# Patient Record
Sex: Male | Born: 1965 | Race: White | Hispanic: No | Marital: Married | Smoking: Former smoker
Health system: Southern US, Community
[De-identification: ages and names within clinical notes are randomized; demographics above are authoritative.]

## PROBLEM LIST (undated history)

## (undated) DIAGNOSIS — G5600 Carpal tunnel syndrome, unspecified upper limb: Secondary | ICD-10-CM

## (undated) DIAGNOSIS — E785 Hyperlipidemia, unspecified: Secondary | ICD-10-CM

## (undated) DIAGNOSIS — R251 Tremor, unspecified: Secondary | ICD-10-CM

## (undated) DIAGNOSIS — E119 Type 2 diabetes mellitus without complications: Secondary | ICD-10-CM

## (undated) DIAGNOSIS — I10 Essential (primary) hypertension: Secondary | ICD-10-CM

## (undated) DIAGNOSIS — M1990 Unspecified osteoarthritis, unspecified site: Secondary | ICD-10-CM

## (undated) DIAGNOSIS — Q282 Arteriovenous malformation of cerebral vessels: Secondary | ICD-10-CM

## (undated) DIAGNOSIS — G4730 Sleep apnea, unspecified: Secondary | ICD-10-CM

## (undated) DIAGNOSIS — Z87442 Personal history of urinary calculi: Secondary | ICD-10-CM

## (undated) DIAGNOSIS — R519 Headache, unspecified: Secondary | ICD-10-CM

## (undated) DIAGNOSIS — J45909 Unspecified asthma, uncomplicated: Secondary | ICD-10-CM

## (undated) HISTORY — PX: CEREBRAL ANGIOGRAM: SHX1326

## (undated) HISTORY — PX: WISDOM TOOTH EXTRACTION: SHX21

---

## 2006-03-25 ENCOUNTER — Emergency Department: Payer: Self-pay | Admitting: General Practice

## 2006-04-15 ENCOUNTER — Ambulatory Visit: Payer: Self-pay | Admitting: Specialist

## 2008-11-25 ENCOUNTER — Emergency Department: Payer: Self-pay | Admitting: Emergency Medicine

## 2008-12-07 ENCOUNTER — Emergency Department: Payer: Self-pay | Admitting: Emergency Medicine

## 2009-02-21 ENCOUNTER — Ambulatory Visit: Payer: Self-pay | Admitting: Internal Medicine

## 2009-03-25 ENCOUNTER — Ambulatory Visit: Payer: Self-pay | Admitting: Internal Medicine

## 2009-04-11 ENCOUNTER — Ambulatory Visit: Payer: Self-pay | Admitting: Internal Medicine

## 2009-10-22 ENCOUNTER — Emergency Department: Payer: Self-pay | Admitting: Emergency Medicine

## 2011-02-03 ENCOUNTER — Ambulatory Visit: Payer: Self-pay | Admitting: Otolaryngology

## 2012-05-17 ENCOUNTER — Ambulatory Visit
Admission: RE | Admit: 2012-05-17 | Discharge: 2012-05-17 | Disposition: A | Discharge status: Home / Self Care | Payer: BC Managed Care – PPO | Source: Ambulatory Visit | Attending: Orthopaedic Surgery | Admitting: Orthopaedic Surgery

## 2012-05-17 ENCOUNTER — Other Ambulatory Visit: Payer: Self-pay | Admitting: Orthopaedic Surgery

## 2012-05-17 DIAGNOSIS — M25552 Pain in left hip: Secondary | ICD-10-CM

## 2012-05-17 MED ORDER — IOHEXOL 180 MG/ML  SOLN
18.0000 mL | Freq: Once | INTRAMUSCULAR | Status: AC | PRN
  Administered 2012-05-17: 18 mL via INTRA_ARTICULAR

## 2013-07-06 DIAGNOSIS — Z9989 Dependence on other enabling machines and devices: Secondary | ICD-10-CM

## 2013-07-06 DIAGNOSIS — G4733 Obstructive sleep apnea (adult) (pediatric): Secondary | ICD-10-CM | POA: Insufficient documentation

## 2013-07-06 DIAGNOSIS — Q282 Arteriovenous malformation of cerebral vessels: Secondary | ICD-10-CM | POA: Insufficient documentation

## 2013-07-16 DIAGNOSIS — E1129 Type 2 diabetes mellitus with other diabetic kidney complication: Secondary | ICD-10-CM | POA: Insufficient documentation

## 2014-12-14 ENCOUNTER — Ambulatory Visit: Payer: Self-pay | Admitting: Sports Medicine

## 2015-04-18 DIAGNOSIS — M1A9XX0 Chronic gout, unspecified, without tophus (tophi): Secondary | ICD-10-CM | POA: Insufficient documentation

## 2015-04-18 DIAGNOSIS — M1A00X0 Idiopathic chronic gout, unspecified site, without tophus (tophi): Secondary | ICD-10-CM | POA: Insufficient documentation

## 2015-04-18 DIAGNOSIS — E782 Mixed hyperlipidemia: Secondary | ICD-10-CM | POA: Insufficient documentation

## 2015-05-02 ENCOUNTER — Other Ambulatory Visit: Payer: Self-pay | Admitting: Internal Medicine

## 2015-05-02 DIAGNOSIS — R51 Headache: Principal | ICD-10-CM

## 2015-05-02 DIAGNOSIS — R519 Headache, unspecified: Secondary | ICD-10-CM

## 2015-05-02 DIAGNOSIS — G9340 Encephalopathy, unspecified: Secondary | ICD-10-CM

## 2015-05-08 ENCOUNTER — Ambulatory Visit
Admission: RE | Admit: 2015-05-08 | Discharge: 2015-05-08 | Disposition: A | Discharge status: Home / Self Care | Payer: BLUE CROSS/BLUE SHIELD | Source: Ambulatory Visit | Attending: Internal Medicine | Admitting: Internal Medicine

## 2015-05-08 DIAGNOSIS — R51 Headache: Secondary | ICD-10-CM | POA: Insufficient documentation

## 2015-05-08 DIAGNOSIS — Q283 Other malformations of cerebral vessels: Secondary | ICD-10-CM | POA: Diagnosis not present

## 2015-05-08 DIAGNOSIS — G9340 Encephalopathy, unspecified: Secondary | ICD-10-CM

## 2015-05-08 DIAGNOSIS — R519 Headache, unspecified: Secondary | ICD-10-CM

## 2015-05-08 DIAGNOSIS — Q282 Arteriovenous malformation of cerebral vessels: Secondary | ICD-10-CM | POA: Diagnosis present

## 2015-05-08 MED ORDER — GADOBENATE DIMEGLUMINE 529 MG/ML IV SOLN
20.0000 mL | Freq: Once | INTRAVENOUS | Status: AC | PRN
  Administered 2015-05-08: 20 mL via INTRAVENOUS

## 2015-07-08 ENCOUNTER — Encounter: Payer: Self-pay | Admitting: *Deleted

## 2015-07-09 ENCOUNTER — Encounter
Admission: RE | Disposition: A | Discharge status: Home / Self Care | Payer: Self-pay | Source: Ambulatory Visit | Attending: Gastroenterology

## 2015-07-09 ENCOUNTER — Ambulatory Visit: Payer: BLUE CROSS/BLUE SHIELD | Admitting: Anesthesiology

## 2015-07-09 ENCOUNTER — Ambulatory Visit
Admission: RE | Admit: 2015-07-09 | Discharge: 2015-07-09 | Disposition: A | Discharge status: Home / Self Care | Payer: BLUE CROSS/BLUE SHIELD | Source: Ambulatory Visit | Attending: Gastroenterology | Admitting: Gastroenterology

## 2015-07-09 DIAGNOSIS — I10 Essential (primary) hypertension: Secondary | ICD-10-CM | POA: Diagnosis not present

## 2015-07-09 DIAGNOSIS — G4730 Sleep apnea, unspecified: Secondary | ICD-10-CM | POA: Insufficient documentation

## 2015-07-09 DIAGNOSIS — Z87891 Personal history of nicotine dependence: Secondary | ICD-10-CM | POA: Diagnosis not present

## 2015-07-09 DIAGNOSIS — Z6839 Body mass index (BMI) 39.0-39.9, adult: Secondary | ICD-10-CM | POA: Insufficient documentation

## 2015-07-09 DIAGNOSIS — M1990 Unspecified osteoarthritis, unspecified site: Secondary | ICD-10-CM | POA: Diagnosis not present

## 2015-07-09 DIAGNOSIS — R195 Other fecal abnormalities: Secondary | ICD-10-CM | POA: Diagnosis not present

## 2015-07-09 DIAGNOSIS — Z79899 Other long term (current) drug therapy: Secondary | ICD-10-CM | POA: Insufficient documentation

## 2015-07-09 DIAGNOSIS — E119 Type 2 diabetes mellitus without complications: Secondary | ICD-10-CM | POA: Insufficient documentation

## 2015-07-09 DIAGNOSIS — K625 Hemorrhage of anus and rectum: Secondary | ICD-10-CM | POA: Insufficient documentation

## 2015-07-09 DIAGNOSIS — Z8371 Family history of colonic polyps: Secondary | ICD-10-CM | POA: Insufficient documentation

## 2015-07-09 DIAGNOSIS — Z7984 Long term (current) use of oral hypoglycemic drugs: Secondary | ICD-10-CM | POA: Diagnosis not present

## 2015-07-09 DIAGNOSIS — J45909 Unspecified asthma, uncomplicated: Secondary | ICD-10-CM | POA: Diagnosis not present

## 2015-07-09 DIAGNOSIS — K644 Residual hemorrhoidal skin tags: Secondary | ICD-10-CM | POA: Diagnosis not present

## 2015-07-09 HISTORY — DX: Unspecified osteoarthritis, unspecified site: M19.90

## 2015-07-09 HISTORY — PX: COLONOSCOPY WITH PROPOFOL: SHX5780

## 2015-07-09 HISTORY — DX: Hyperlipidemia, unspecified: E78.5

## 2015-07-09 HISTORY — DX: Unspecified asthma, uncomplicated: J45.909

## 2015-07-09 HISTORY — DX: Arteriovenous malformation of cerebral vessels: Q28.2

## 2015-07-09 HISTORY — DX: Sleep apnea, unspecified: G47.30

## 2015-07-09 HISTORY — DX: Type 2 diabetes mellitus without complications: E11.9

## 2015-07-09 HISTORY — DX: Carpal tunnel syndrome, unspecified upper limb: G56.00

## 2015-07-09 HISTORY — DX: Essential (primary) hypertension: I10

## 2015-07-09 LAB — GLUCOSE, CAPILLARY: Glucose-Capillary: 101 mg/dL — ABNORMAL HIGH (ref 65–99)

## 2015-07-09 SURGERY — COLONOSCOPY WITH PROPOFOL
Anesthesia: General

## 2015-07-09 MED ORDER — SODIUM CHLORIDE 0.9 % IV SOLN
INTRAVENOUS | Status: DC
  Administered 2015-07-09: 1000 mL via INTRAVENOUS

## 2015-07-09 MED ORDER — PROPOFOL 500 MG/50ML IV EMUL
INTRAVENOUS | Status: DC | PRN
  Administered 2015-07-09: 140 ug/kg/min via INTRAVENOUS

## 2015-07-09 MED ORDER — PHENYLEPHRINE HCL 10 MG/ML IJ SOLN
INTRAMUSCULAR | Status: DC | PRN
  Administered 2015-07-09 (×2): 25 ug via INTRAVENOUS

## 2015-07-09 MED ORDER — FENTANYL CITRATE (PF) 100 MCG/2ML IJ SOLN
INTRAMUSCULAR | Status: DC | PRN
  Administered 2015-07-09: 25 ug via INTRAVENOUS
  Administered 2015-07-09: 50 ug via INTRAVENOUS
  Administered 2015-07-09: 25 ug via INTRAVENOUS

## 2015-07-09 MED ORDER — MIDAZOLAM HCL 2 MG/2ML IJ SOLN
INTRAMUSCULAR | Status: DC | PRN
  Administered 2015-07-09: 2 mg via INTRAVENOUS

## 2015-07-09 MED ORDER — SODIUM CHLORIDE 0.9 % IV SOLN: INTRAVENOUS | Status: DC

## 2015-07-09 NOTE — Anesthesia Postprocedure Evaluation (Signed)
Anesthesia Post Note  Patient: Jerry FootmanChristopher W Sandoval  Procedure(s) Performed: Procedure(s) (LRB): COLONOSCOPY WITH PROPOFOL (N/A)  Patient location during evaluation: PACU Anesthesia Type: General Level of consciousness: awake Pain management: pain level controlled Vital Signs Assessment: post-procedure vital signs reviewed and stable Respiratory status: spontaneous breathing Cardiovascular status: stable Anesthetic complications: no    Last Vitals:  Filed Vitals:   07/09/15 1210 07/09/15 1220  BP: 112/75 109/72  Pulse: 79 77  Temp:    Resp: 20 25    Last Pain: There were no vitals filed for this visit.               VAN STAVEREN,Sujey Gundry

## 2015-07-09 NOTE — Anesthesia Preprocedure Evaluation (Signed)
Anesthesia Evaluation  Patient identified by MRN, date of birth, ID band Patient awake    Reviewed: Allergy & Precautions, NPO status , Patient's Chart, lab work & pertinent test results  Airway Mallampati: II       Dental  (+) Teeth Intact   Pulmonary sleep apnea , former smoker,    + rhonchi        Cardiovascular Exercise Tolerance: Good hypertension, Pt. on medications  Rhythm:Regular Rate:Normal     Neuro/Psych    GI/Hepatic negative GI ROS, Neg liver ROS,   Endo/Other  diabetes, Well Controlled, Type 2, Oral Hypoglycemic AgentsMorbid obesity  Renal/GU negative Renal ROS     Musculoskeletal   Abdominal (+) + obese,   Peds  Hematology negative hematology ROS (+)   Anesthesia Other Findings   Reproductive/Obstetrics                             Anesthesia Physical Anesthesia Plan  ASA: III  Anesthesia Plan: General   Post-op Pain Management:    Induction: Intravenous  Airway Management Planned: Natural Airway and Nasal Cannula  Additional Equipment:   Intra-op Plan:   Post-operative Plan:   Informed Consent: I have reviewed the patients History and Physical, chart, labs and discussed the procedure including the risks, benefits and alternatives for the proposed anesthesia with the patient or authorized representative who has indicated his/her understanding and acceptance.     Plan Discussed with: CRNA  Anesthesia Plan Comments:         Anesthesia Quick Evaluation

## 2015-07-09 NOTE — Anesthesia Procedure Notes (Signed)
Performed by: Tonia GhentOOK-MARTIN, Jennie Bolar Pre-anesthesia Checklist: Patient identified, Emergency Drugs available, Suction available, Patient being monitored and Timeout performed Patient Re-evaluated:Patient Re-evaluated prior to inductionOxygen Delivery Method: Non-rebreather mask Preoxygenation: Pre-oxygenation with 100% oxygen Intubation Type: IV induction Placement Confirmation: CO2 detector and positive ETCO2

## 2015-07-09 NOTE — H&P (Signed)
Outpatient short stay form Pre-procedure 07/09/2015 11:05 AM Jerry DeemMartin U Shervon Kerwin MD  Primary Physician: Dr. Bethann PunchesMark Miller  Reason for visit:  Colonoscopy  History of present illness:  Patient is a 50 year old male presenting today for further evaluation of a Hemoccult-positive stool. He states is also seen some intermittent rectal bleeding that he describes as being painless. That he had hemorrhoids in the past however feels he may have him. He was no dyschezia. There is a family history of colon polyps but no colon cancer. Tolerated his prep well. He takes no aspirin or blood thinning products.    Current facility-administered medications:  .  0.9 %  sodium chloride infusion, , Intravenous, Continuous, Jerry DeemMartin U Marjani Kobel, MD, Last Rate: 20 mL/hr at 07/09/15 1031, 1,000 mL at 07/09/15 1031 .  0.9 %  sodium chloride infusion, , Intravenous, Continuous, Jerry DeemMartin U Gerarda Conklin, MD  Prescriptions prior to admission  Medication Sig Dispense Refill Last Dose  . albuterol (PROVENTIL HFA;VENTOLIN HFA) 108 (90 Base) MCG/ACT inhaler Inhale 2 puffs into the lungs every 6 (six) hours as needed for wheezing or shortness of breath.     . allopurinol (ZYLOPRIM) 100 MG tablet Take 100 mg by mouth daily.     Marland Kitchen. allopurinol (ZYLOPRIM) 300 MG tablet Take 300 mg by mouth daily.     . cetirizine (ZYRTEC) 10 MG tablet Take 10 mg by mouth daily.     . cyclobenzaprine (FLEXERIL) 10 MG tablet Take 10 mg by mouth 2 (two) times daily as needed for muscle spasms.     . diazepam (VALIUM) 5 MG tablet Take 5 mg by mouth daily.     . indomethacin (INDOCIN) 50 MG capsule Take 50 mg by mouth 2 (two) times daily with a meal.     . lisinopril-hydrochlorothiazide (PRINZIDE,ZESTORETIC) 20-12.5 MG tablet Take 1 tablet by mouth daily.     . magnesium oxide (MAG-OX) 400 MG tablet Take 400 mg by mouth daily.     . metFORMIN (GLUCOPHAGE) 500 MG tablet Take 1,000 mg by mouth 2 (two) times daily with a meal.     . topiramate (TOPAMAX) 50 MG  tablet Take 50 mg by mouth 2 (two) times daily.     . Vitamin D, Ergocalciferol, (DRISDOL) 50000 units CAPS capsule Take 50,000 Units by mouth every 7 (seven) days.        No Known Allergies   Past Medical History  Diagnosis Date  . Asthma   . Arteriovenous malformation of cerebral vessels   . Carpal tunnel syndrome   . Arthritis   . Diabetes mellitus without complication (HCC)   . Hypertension   . Hyperlipidemia   . Sleep apnea     Review of systems:      Physical Exam    Heart and lungs: Regular rate and rhythm without rub or gallop, lungs are bilaterally clear.    HEENT: Normocephalic atraumatic eyes are anicteric    Other:     Pertinant exam for procedure: Soft nontender nondistended bowel sounds positive normoactive.    Planned proceedures: Colonoscopy and indicated procedures. I have discussed the risks benefits and complications of procedures to include not limited to bleeding, infection, perforation and the risk of sedation and the patient wishes to proceed.    Jerry DeemMartin U Saahas Hidrogo, MD Gastroenterology 07/09/2015  11:05 AM

## 2015-07-09 NOTE — Op Note (Signed)
[facility] Patient Name: Jerry Sandoval Procedure Date: 07/09/2015 11:07 AM MRN: [id number] Account #: [id number] Date of Birth: 03-Apr-1965 Admit Type: Outpatient Age: 50 Room: [facility] ENDO ROOM 3 Gender: Male Note Status: Finalized Procedure:            Colonoscopy Indications:          Heme positive stool, Rectal bleeding Providers:            Christena Deem, MD Referring MD:         Danella Penton, MD (Referring MD) Medicines:            Monitored Anesthesia Care Complications:        No immediate complications. Procedure:            Pre-Anesthesia Assessment:                       - ASA Grade Assessment: III - A patient with severe                        systemic disease.                       After obtaining informed consent, the colonoscope was                        passed under direct vision. Throughout the procedure,                        the patient's blood pressure, pulse, and oxygen                        saturations were monitored continuously. The                        Colonoscope was introduced through the anus and                        advanced to the the cecum, identified by the ileocecal                        valve. The quality of the bowel preparation was good.                        The colonoscopy was unusually difficult due to                        significant looping and the patient's body habitus. Findings:      Non-bleeding external hemorrhoids were found during perianal exam. The       hemorrhoids were small.      The entire examined colon appeared normal. Despite multiple efforts I       was unable to turn the ileocecal valve to see the base of the cecum,       however seen from a distance there is no appareent abnormality.      The retroflexed view of the distal rectum and anal verge was normal and       showed no anal or rectal abnormalities.      The digital rectal exam findings include non-thrombosed  external       hemorrhoids. Impression:           -  Non-bleeding external hemorrhoids.                       - The entire examined colon is normal.                       - The distal rectum and anal verge are normal on                        retroflexion view.                       - Non-thrombosed external hemorrhoids found on digital                        rectal exam.                       - No specimens collected. Recommendation:       - Use Analpram HC Cream 2.5%: Apply externally TID for                        10 days.                       - Repeat colonoscopy in 5 years for screening purposes. Procedure Code(s):    --- Professional ---                       [phone] [id number], Colonoscopy, flexible; diagnostic, including                        collection of specimen(s) by brushing or washing, when                        performed (separate procedure) Diagnosis Code(s):    --- Professional ---                       K64.4, Residual hemorrhoidal skin tags                       R19.5, Other fecal abnormalities                       K62.5, Hemorrhage of anus and rectum CPT copyright 2016 American Medical Association. All rights reserved. The codes documented in this report are preliminary and upon coder review may  be revised to meet current compliance requirements. Christena DeemMartin U Skulskie, MD 07/09/2015 11:50:34 AM This report has been signed electronically. Number of Addenda: 0 Note Initiated On: 07/09/2015 11:07 AM Scope Withdrawal Time: 0 hours 6 minutes 30 seconds  Total Procedure Duration: 0 hours 27 minutes 46 seconds       [facility]

## 2015-07-09 NOTE — Transfer of Care (Signed)
Immediate Anesthesia Transfer of Care Note  Patient: Jerry FootmanChristopher W Sandoval  Procedure(s) Performed: Procedure(s): COLONOSCOPY WITH PROPOFOL (N/A)  Patient Location: PACU  Anesthesia Type:General  Level of Consciousness: awake and sedated  Airway & Oxygen Therapy: Patient Spontanous Breathing and Patient connected to face mask oxygen  Post-op Assessment: Report given to RN and Post -op Vital signs reviewed and stable  Post vital signs: Reviewed and stable  Last Vitals:  Filed Vitals:   07/09/15 1011  BP: 117/77  Pulse: 96  Temp: 37.1 C  Resp: 18    Last Pain: There were no vitals filed for this visit.       Complications: No apparent anesthesia complications

## 2015-07-10 ENCOUNTER — Encounter: Payer: Self-pay | Admitting: Gastroenterology

## 2015-08-15 ENCOUNTER — Ambulatory Visit: Payer: BLUE CROSS/BLUE SHIELD | Attending: Neurology

## 2015-08-15 DIAGNOSIS — R634 Abnormal weight loss: Secondary | ICD-10-CM | POA: Insufficient documentation

## 2015-08-15 DIAGNOSIS — G4733 Obstructive sleep apnea (adult) (pediatric): Secondary | ICD-10-CM | POA: Diagnosis not present

## 2015-10-22 DIAGNOSIS — K649 Unspecified hemorrhoids: Secondary | ICD-10-CM | POA: Insufficient documentation

## 2015-10-22 DIAGNOSIS — I10 Essential (primary) hypertension: Secondary | ICD-10-CM | POA: Insufficient documentation

## 2016-03-22 ENCOUNTER — Inpatient Hospital Stay
Admission: EM | Admit: 2016-03-22 | Discharge: 2016-03-25 | DRG: 152 | Disposition: A | Discharge status: Home / Self Care | Payer: BLUE CROSS/BLUE SHIELD | Attending: Internal Medicine | Admitting: Internal Medicine

## 2016-03-22 ENCOUNTER — Emergency Department: Payer: BLUE CROSS/BLUE SHIELD

## 2016-03-22 ENCOUNTER — Encounter: Payer: Self-pay | Admitting: Emergency Medicine

## 2016-03-22 DIAGNOSIS — G4730 Sleep apnea, unspecified: Secondary | ICD-10-CM | POA: Diagnosis present

## 2016-03-22 DIAGNOSIS — Q283 Other malformations of cerebral vessels: Secondary | ICD-10-CM

## 2016-03-22 DIAGNOSIS — R509 Fever, unspecified: Secondary | ICD-10-CM

## 2016-03-22 DIAGNOSIS — E876 Hypokalemia: Secondary | ICD-10-CM | POA: Diagnosis present

## 2016-03-22 DIAGNOSIS — Z6841 Body Mass Index (BMI) 40.0 and over, adult: Secondary | ICD-10-CM

## 2016-03-22 DIAGNOSIS — J45909 Unspecified asthma, uncomplicated: Secondary | ICD-10-CM | POA: Diagnosis present

## 2016-03-22 DIAGNOSIS — Z720 Tobacco use: Secondary | ICD-10-CM

## 2016-03-22 DIAGNOSIS — E119 Type 2 diabetes mellitus without complications: Secondary | ICD-10-CM | POA: Diagnosis present

## 2016-03-22 DIAGNOSIS — R000 Tachycardia, unspecified: Secondary | ICD-10-CM

## 2016-03-22 DIAGNOSIS — E860 Dehydration: Secondary | ICD-10-CM | POA: Diagnosis present

## 2016-03-22 DIAGNOSIS — E669 Obesity, unspecified: Secondary | ICD-10-CM | POA: Diagnosis present

## 2016-03-22 DIAGNOSIS — J069 Acute upper respiratory infection, unspecified: Principal | ICD-10-CM | POA: Diagnosis present

## 2016-03-22 DIAGNOSIS — E785 Hyperlipidemia, unspecified: Secondary | ICD-10-CM | POA: Diagnosis present

## 2016-03-22 DIAGNOSIS — I10 Essential (primary) hypertension: Secondary | ICD-10-CM | POA: Diagnosis present

## 2016-03-22 DIAGNOSIS — R6510 Systemic inflammatory response syndrome (SIRS) of non-infectious origin without acute organ dysfunction: Secondary | ICD-10-CM | POA: Diagnosis present

## 2016-03-22 DIAGNOSIS — H9201 Otalgia, right ear: Secondary | ICD-10-CM

## 2016-03-22 DIAGNOSIS — Z7984 Long term (current) use of oral hypoglycemic drugs: Secondary | ICD-10-CM

## 2016-03-22 DIAGNOSIS — E871 Hypo-osmolality and hyponatremia: Secondary | ICD-10-CM | POA: Diagnosis present

## 2016-03-22 DIAGNOSIS — H6691 Otitis media, unspecified, right ear: Secondary | ICD-10-CM | POA: Diagnosis present

## 2016-03-22 LAB — URINALYSIS, COMPLETE (UACMP) WITH MICROSCOPIC
Bacteria, UA: NONE SEEN
Bilirubin Urine: NEGATIVE
Glucose, UA: NEGATIVE mg/dL
Ketones, ur: NEGATIVE mg/dL
Leukocytes, UA: NEGATIVE
Nitrite: NEGATIVE
Protein, ur: NEGATIVE mg/dL
Specific Gravity, Urine: 1.008 (ref 1.005–1.030)
Squamous Epithelial / LPF: NONE SEEN
pH: 5 (ref 5.0–8.0)

## 2016-03-22 LAB — CBC WITH DIFFERENTIAL/PLATELET
Basophils Absolute: 0.1 10*3/uL (ref 0–0.1)
Basophils Relative: 1 %
Eosinophils Absolute: 0.3 10*3/uL (ref 0–0.7)
Eosinophils Relative: 2 %
HCT: 45.3 % (ref 40.0–52.0)
Hemoglobin: 15 g/dL (ref 13.0–18.0)
Lymphocytes Relative: 20 %
Lymphs Abs: 2.5 10*3/uL (ref 1.0–3.6)
MCH: 30.1 pg (ref 26.0–34.0)
MCHC: 33 g/dL (ref 32.0–36.0)
MCV: 91.1 fL (ref 80.0–100.0)
Monocytes Absolute: 1 10*3/uL (ref 0.2–1.0)
Monocytes Relative: 8 %
Neutro Abs: 8.7 10*3/uL — ABNORMAL HIGH (ref 1.4–6.5)
Neutrophils Relative %: 69 %
Platelets: 279 10*3/uL (ref 150–440)
RBC: 4.97 MIL/uL (ref 4.40–5.90)
RDW: 14.4 % (ref 11.5–14.5)
WBC: 12.6 10*3/uL — ABNORMAL HIGH (ref 3.8–10.6)

## 2016-03-22 LAB — COMPREHENSIVE METABOLIC PANEL
ALT: 25 U/L (ref 17–63)
AST: 25 U/L (ref 15–41)
Albumin: 4.8 g/dL (ref 3.5–5.0)
Alkaline Phosphatase: 50 U/L (ref 38–126)
Anion gap: 11 (ref 5–15)
BUN: 15 mg/dL (ref 6–20)
CO2: 24 mmol/L (ref 22–32)
Calcium: 9.5 mg/dL (ref 8.9–10.3)
Chloride: 98 mmol/L — ABNORMAL LOW (ref 101–111)
Creatinine, Ser: 1.23 mg/dL (ref 0.61–1.24)
GFR calc Af Amer: >60 mL/min (ref >60)
GFR calc non Af Amer: >60 mL/min (ref >60)
Glucose, Bld: 109 mg/dL — ABNORMAL HIGH (ref 65–99)
Potassium: 3.5 mmol/L (ref 3.5–5.1)
Sodium: 133 mmol/L — ABNORMAL LOW (ref 135–145)
Total Bilirubin: 0.9 mg/dL (ref 0.3–1.2)
Total Protein: 8.2 g/dL — ABNORMAL HIGH (ref 6.5–8.1)

## 2016-03-22 LAB — PROTIME-INR
INR: 1.04
Prothrombin Time: 13.6 seconds (ref 11.4–15.2)

## 2016-03-22 LAB — INFLUENZA PANEL BY PCR (TYPE A & B)
Influenza A By PCR: NEGATIVE
Influenza B By PCR: NEGATIVE

## 2016-03-22 LAB — LACTIC ACID, PLASMA: Lactic Acid, Venous: 1.9 mmol/L (ref 0.5–1.9)

## 2016-03-22 LAB — GLUCOSE, CAPILLARY: Glucose-Capillary: 120 mg/dL — ABNORMAL HIGH (ref 65–99)

## 2016-03-22 MED ORDER — ACETAMINOPHEN 500 MG PO TABS
1000.0000 mg | ORAL_TABLET | Freq: Once | ORAL | Status: AC
  Administered 2016-03-22: 1000 mg via ORAL
  Filled 2016-03-22: qty 2

## 2016-03-22 MED ORDER — SODIUM CHLORIDE 0.9 % IV BOLUS (SEPSIS)
1000.0000 mL | Freq: Once | INTRAVENOUS | Status: AC
  Administered 2016-03-22: 1000 mL via INTRAVENOUS

## 2016-03-22 MED ORDER — SODIUM CHLORIDE 0.9 % IV BOLUS (SEPSIS)
1000.0000 mL | Freq: Once | INTRAVENOUS | Status: AC
Start: 2016-03-22 — End: 2016-03-23
  Administered 2016-03-22: 1000 mL via INTRAVENOUS

## 2016-03-22 NOTE — ED Provider Notes (Addendum)
[facility] >[facility] Emergency Department Provider Note  ____________________________________________   I have reviewed the triage vital signs and the nursing notes.   HISTORY  Chief Complaint Fever; Dizziness; and Cough    HPI Jerry Sandoval is a 51 y.o. male  presents today plenty of cough, fever, rhinorrhea, and a few loose stools. Has had decreased by mouth. Started on Augmentin a few days ago. Denies severe headache, denies abdominal pain, cough is nonproductive. Positive rhinorrhea. Did see his doctor. Thought he had flu symptoms       Past Medical History:  Diagnosis Date  . Arteriovenous malformation of cerebral vessels   . Arthritis   . Asthma   . Carpal tunnel syndrome   . Diabetes mellitus without complication (HCC)   . Hyperlipidemia   . Hypertension   . Sleep apnea     There are no active problems to display for this patient.   Past Surgical History:  Procedure Laterality Date  . CEREBRAL ANGIOGRAM    . COLONOSCOPY WITH PROPOFOL N/A 07/09/2015   Procedure: COLONOSCOPY WITH PROPOFOL;  Surgeon: Christena Deem, MD;  Location: [facility] ENDOSCOPY;  Service: Endoscopy;  Laterality: N/A;  . WISDOM TOOTH EXTRACTION      Prior to Admission medications   Medication Sig Start Date End Date Taking? Authorizing Provider  albuterol (PROVENTIL HFA;VENTOLIN HFA) 108 (90 Base) MCG/ACT inhaler Inhale 2 puffs into the lungs every 6 (six) hours as needed for wheezing or shortness of breath.    Historical Provider, MD  allopurinol (ZYLOPRIM) 100 MG tablet Take 100 mg by mouth daily.    Historical Provider, MD  allopurinol (ZYLOPRIM) 300 MG tablet Take 300 mg by mouth daily.    Historical Provider, MD  cetirizine (ZYRTEC) 10 MG tablet Take 10 mg by mouth daily.    Historical Provider, MD  cyclobenzaprine (FLEXERIL) 10 MG tablet Take 10 mg by mouth 2 (two) times daily as needed for muscle spasms.    Historical Provider, MD  diazepam  (VALIUM) 5 MG tablet Take 5 mg by mouth daily.    Historical Provider, MD  indomethacin (INDOCIN) 50 MG capsule Take 50 mg by mouth 2 (two) times daily with a meal.    Historical Provider, MD  lisinopril-hydrochlorothiazide (PRINZIDE,ZESTORETIC) 20-12.5 MG tablet Take 1 tablet by mouth daily.    Historical Provider, MD  magnesium oxide (MAG-OX) 400 MG tablet Take 400 mg by mouth daily.    Historical Provider, MD  metFORMIN (GLUCOPHAGE) 500 MG tablet Take 1,000 mg by mouth 2 (two) times daily with a meal.    Historical Provider, MD  topiramate (TOPAMAX) 50 MG tablet Take 50 mg by mouth 2 (two) times daily.    Historical Provider, MD  Vitamin D, Ergocalciferol, (DRISDOL) 50000 units CAPS capsule Take 50,000 Units by mouth every 7 (seven) days.    Historical Provider, MD    Allergies Patient has no known allergies.  History reviewed. No pertinent family history.  Social History Social History  Substance Use Topics  . Smoking status: Former Games developer  . Smokeless tobacco: Current User  . Alcohol use No    Review of Systems Constitutional: Positive fever/chills Eyes: No visual changes. ENT: No sore throat. No stiff neck no neck pain Cardiovascular: Denies chest pain. Respiratory: Denies shortness of breath.All that of cough Gastrointestinal:   no vomiting.  No diarrhea.  No constipation. Genitourinary: Negative for dysuria. Musculoskeletal: Negative lower extremity swelling Skin: Negative for rash. Neurological: Negative for severe headaches, focal weakness  or numbness. 10-point ROS otherwise negative.  ____________________________________________   PHYSICAL EXAM:  VITAL SIGNS: ED Triage Vitals  Enc Vitals Group     BP 03/22/16 1828 135/84     Pulse Rate 03/22/16 1828 (!) 137     Resp 03/22/16 1828 (!) 22     Temp 03/22/16 1828 (!) 102.6 F (39.2 C)     Temp Source 03/22/16 1828 Oral     SpO2 03/22/16 1828 96 %     Weight --      Height --      Head Circumference --       Peak Flow --      Pain Score 03/22/16 1956 10     Pain Loc --      Pain Edu? --      Excl. in GC? --     Constitutional: Alert and oriented. Well appearing and in no acute distress.Patient does not appear sick is his heart rate would suggest. Watching TV in no acute distress. Eyes: Conjunctivae are normal. PERRL. EOMI. Head: Atraumatic. Nose: Positive congestion/rhinnorhea. TMs are normal Mouth/Throat: Mucous membranes are dry.  Oropharynx non-erythematous. Neck: No stridor.   Nontender with no meningismus Cardiovascular: Tachycardia noted, regular rhythm. Grossly normal heart sounds.  Good peripheral circulation. Respiratory: Normal respiratory effort.  No retractions. Lungs CTAB. Abdominal: Soft and nontender. No distention. No guarding no rebound Back:  There is no focal tenderness or step off.  there is no midline tenderness there are no lesions noted. there is no CVA tenderness Musculoskeletal: No lower extremity tenderness, no upper extremity tenderness. No joint effusions, no DVT signs strong distal pulses no edema Neurologic:  Normal speech and language. No gross focal neurologic deficits are appreciated.  Skin:  Skin is warm, dry and intact. No rash noted. Psychiatric: Mood and affect are normal. Speech and behavior are normal.  ____________________________________________   LABS (all labs ordered are listed, but only abnormal results are displayed)  Labs Reviewed  COMPREHENSIVE METABOLIC PANEL - Abnormal; Notable for the following:       Result Value   Sodium 133 (*)    Chloride 98 (*)    Glucose, Bld 109 (*)    Total Protein 8.2 (*)    All other components within normal limits  CBC WITH DIFFERENTIAL/PLATELET - Abnormal; Notable for the following:    WBC 12.6 (*)    Neutro Abs 8.7 (*)    All other components within normal limits  GLUCOSE, CAPILLARY - Abnormal; Notable for the following:    Glucose-Capillary 120 (*)    All other components within normal limits   CULTURE, BLOOD (ROUTINE X 2)  CULTURE, BLOOD (ROUTINE X 2)  URINE CULTURE  LACTIC ACID, PLASMA  PROTIME-INR  INFLUENZA PANEL BY PCR (TYPE A & B)  LACTIC ACID, PLASMA  URINALYSIS, COMPLETE (UACMP) WITH MICROSCOPIC   ____________________________________________  EKG  I personally interpreted any EKGs ordered by me or triage Sinus tachycardia rate 131 no acute ST elevation or depression, borderline LAD. ____________________________________________  RADIOLOGY  I reviewed any imaging ordered by me or triage that were performed during my shift and, if possible, patient and/or family made aware of any abnormal findings. ____________________________________________   PROCEDURES  Procedure(s) performed: None  Procedures  Critical Care performed: None  ____________________________________________   INITIAL IMPRESSION / ASSESSMENT AND PLAN / ED COURSE  Pertinent labs & imaging results that were available during my care of the patient were reviewed by me and considered in my medical decision making (  see chart for details).  Patient with URI symptoms, cough cold congestion fever. Flu is negative. White count reassuring. He was tachycardic initially but that is rapidly coming in with IV fluid. Low suspicion for sepsis given patient's negative lactic acid regular white count etc. Clear viral syndrome pathology with cough etc. Some mild internal vascular depletion is probably present with hemoglobin of 15 and creatinine of 1.23. Already on antibiotics. It is my hope that we can hydrate him and he will feel better. We'll watch him closely in the emergency department. abd is benign chest x-ray is clear     Signed out to dr. Don Perking at the end of my shift. ____________________________________________   FINAL CLINICAL IMPRESSION(S) / ED DIAGNOSES  Final diagnoses:  None      This chart was dictated using voice recognition software.  Despite best efforts to proofread,  errors can  occur which can change meaning.      Jeanmarie Plant, MD 03/22/16 2018    Jeanmarie Plant, MD 03/22/16 2030

## 2016-03-22 NOTE — ED Triage Notes (Signed)
Pt reports cough and fever sxs that started earlier this past week. Pt was started on Augmentin on Tuesday for 10 days, continues to have fever and cough and congestion. Pt dizzy in lobby, unsteady gait.  Pt took tylenol 1000mg  2hours PTA.  Pt is also tremulous and is a diabetic. Did receive flu shot.

## 2016-03-22 NOTE — ED Notes (Signed)
Pt taken to xray 

## 2016-03-23 ENCOUNTER — Encounter: Payer: Self-pay | Admitting: Internal Medicine

## 2016-03-23 ENCOUNTER — Observation Stay: Payer: BLUE CROSS/BLUE SHIELD

## 2016-03-23 DIAGNOSIS — R6510 Systemic inflammatory response syndrome (SIRS) of non-infectious origin without acute organ dysfunction: Secondary | ICD-10-CM | POA: Diagnosis present

## 2016-03-23 LAB — TROPONIN I
Troponin I: <0.03 ng/mL (ref <0.03)
Troponin I: <0.03 ng/mL (ref <0.03)
Troponin I: <0.03 ng/mL (ref <0.03)

## 2016-03-23 LAB — GLUCOSE, CAPILLARY
Glucose-Capillary: 129 mg/dL — ABNORMAL HIGH (ref 65–99)
Glucose-Capillary: 90 mg/dL (ref 65–99)
Glucose-Capillary: 111 mg/dL — ABNORMAL HIGH (ref 65–99)
Glucose-Capillary: 86 mg/dL (ref 65–99)

## 2016-03-23 LAB — BASIC METABOLIC PANEL
Anion gap: 7 (ref 5–15)
BUN: 14 mg/dL (ref 6–20)
CO2: 22 mmol/L (ref 22–32)
Calcium: 8.7 mg/dL — ABNORMAL LOW (ref 8.9–10.3)
Chloride: 106 mmol/L (ref 101–111)
Creatinine, Ser: 1.28 mg/dL — ABNORMAL HIGH (ref 0.61–1.24)
GFR calc Af Amer: >60 mL/min (ref >60)
GFR calc non Af Amer: >60 mL/min (ref >60)
Glucose, Bld: 126 mg/dL — ABNORMAL HIGH (ref 65–99)
Potassium: 3.6 mmol/L (ref 3.5–5.1)
Sodium: 135 mmol/L (ref 135–145)

## 2016-03-23 LAB — CBC
HCT: 38.3 % — ABNORMAL LOW (ref 40.0–52.0)
Hemoglobin: 13.3 g/dL (ref 13.0–18.0)
MCH: 31.4 pg (ref 26.0–34.0)
MCHC: 34.8 g/dL (ref 32.0–36.0)
MCV: 90.2 fL (ref 80.0–100.0)
Platelets: 219 10*3/uL (ref 150–440)
RBC: 4.25 MIL/uL — ABNORMAL LOW (ref 4.40–5.90)
RDW: 14 % (ref 11.5–14.5)
WBC: 9.9 10*3/uL (ref 3.8–10.6)

## 2016-03-23 LAB — ETHANOL: Alcohol, Ethyl (B): <5 mg/dL (ref <5)

## 2016-03-23 MED ORDER — DIAZEPAM 5 MG PO TABS
5.0000 mg | ORAL_TABLET | Freq: Every day | ORAL | Status: DC
  Filled 2016-03-23 (×2): qty 1

## 2016-03-23 MED ORDER — POTASSIUM CHLORIDE CRYS ER 20 MEQ PO TBCR
40.0000 meq | EXTENDED_RELEASE_TABLET | Freq: Once | ORAL | Status: AC
  Administered 2016-03-23: 40 meq via ORAL
  Filled 2016-03-23: qty 2

## 2016-03-23 MED ORDER — TOPIRAMATE 25 MG PO TABS
50.0000 mg | ORAL_TABLET | Freq: Two times a day (BID) | ORAL | Status: DC
  Administered 2016-03-23 – 2016-03-25 (×5): 50 mg via ORAL
  Filled 2016-03-23 (×6): qty 2

## 2016-03-23 MED ORDER — IBUPROFEN 600 MG PO TABS
600.0000 mg | ORAL_TABLET | Freq: Once | ORAL | Status: AC
  Administered 2016-03-23: 600 mg via ORAL
  Filled 2016-03-23: qty 1

## 2016-03-23 MED ORDER — ONDANSETRON HCL 4 MG PO TABS: 4.0000 mg | ORAL_TABLET | Freq: Four times a day (QID) | ORAL | Status: DC | PRN

## 2016-03-23 MED ORDER — IBUPROFEN 400 MG PO TABS
400.0000 mg | ORAL_TABLET | Freq: Four times a day (QID) | ORAL | Status: DC | PRN
  Administered 2016-03-23 – 2016-03-24 (×3): 400 mg via ORAL
  Filled 2016-03-23 (×3): qty 1

## 2016-03-23 MED ORDER — ACETAMINOPHEN 500 MG PO TABS
1000.0000 mg | ORAL_TABLET | Freq: Once | ORAL | Status: AC
  Administered 2016-03-23: 1000 mg via ORAL
  Filled 2016-03-23: qty 2

## 2016-03-23 MED ORDER — INSULIN ASPART 100 UNIT/ML ~~LOC~~ SOLN
0.0000 [IU] | Freq: Three times a day (TID) | SUBCUTANEOUS | Status: DC
  Administered 2016-03-23: 2 [IU] via SUBCUTANEOUS
  Filled 2016-03-23: qty 2

## 2016-03-23 MED ORDER — HYDROCODONE-ACETAMINOPHEN 5-325 MG PO TABS: 1.0000 | ORAL_TABLET | ORAL | Status: DC | PRN

## 2016-03-23 MED ORDER — MAGNESIUM OXIDE 400 (241.3 MG) MG PO TABS
400.0000 mg | ORAL_TABLET | Freq: Every day | ORAL | Status: DC
  Administered 2016-03-23 – 2016-03-25 (×3): 400 mg via ORAL
  Filled 2016-03-23 (×3): qty 1

## 2016-03-23 MED ORDER — METFORMIN HCL 500 MG PO TABS
1000.0000 mg | ORAL_TABLET | Freq: Two times a day (BID) | ORAL | Status: DC
  Administered 2016-03-23 – 2016-03-25 (×4): 1000 mg via ORAL
  Filled 2016-03-23 (×4): qty 2

## 2016-03-23 MED ORDER — VITAMIN D (ERGOCALCIFEROL) 1.25 MG (50000 UNIT) PO CAPS
50000.0000 [IU] | ORAL_CAPSULE | ORAL | Status: DC
  Administered 2016-03-24: 50000 [IU] via ORAL
  Filled 2016-03-23: qty 1

## 2016-03-23 MED ORDER — ACETAMINOPHEN 325 MG PO TABS
650.0000 mg | ORAL_TABLET | Freq: Four times a day (QID) | ORAL | Status: DC | PRN
  Administered 2016-03-23 – 2016-03-24 (×3): 650 mg via ORAL
  Filled 2016-03-23 (×3): qty 2

## 2016-03-23 MED ORDER — AZITHROMYCIN 500 MG PO TABS
500.0000 mg | ORAL_TABLET | Freq: Every day | ORAL | Status: AC
  Administered 2016-03-23: 500 mg via ORAL
  Filled 2016-03-23: qty 1

## 2016-03-23 MED ORDER — LEVOFLOXACIN 500 MG PO TABS
500.0000 mg | ORAL_TABLET | Freq: Every day | ORAL | Status: DC
  Administered 2016-03-24 – 2016-03-25 (×2): 500 mg via ORAL
  Filled 2016-03-23 (×2): qty 1

## 2016-03-23 MED ORDER — ALLOPURINOL 100 MG PO TABS
300.0000 mg | ORAL_TABLET | Freq: Every day | ORAL | Status: DC
  Administered 2016-03-23 – 2016-03-25 (×3): 300 mg via ORAL
  Filled 2016-03-23 (×3): qty 3

## 2016-03-23 MED ORDER — AZITHROMYCIN 250 MG PO TABS: 250.0000 mg | ORAL_TABLET | Freq: Every day | ORAL | Status: DC

## 2016-03-23 MED ORDER — LORATADINE 10 MG PO TABS
10.0000 mg | ORAL_TABLET | Freq: Every day | ORAL | Status: DC
  Administered 2016-03-23 – 2016-03-25 (×3): 10 mg via ORAL
  Filled 2016-03-23 (×3): qty 1

## 2016-03-23 MED ORDER — INSULIN ASPART 100 UNIT/ML ~~LOC~~ SOLN: 0.0000 [IU] | Freq: Every day | SUBCUTANEOUS | Status: DC

## 2016-03-23 MED ORDER — SENNOSIDES-DOCUSATE SODIUM 8.6-50 MG PO TABS: 1.0000 | ORAL_TABLET | Freq: Every evening | ORAL | Status: DC | PRN

## 2016-03-23 MED ORDER — SODIUM CHLORIDE 0.9% FLUSH
3.0000 mL | Freq: Two times a day (BID) | INTRAVENOUS | Status: DC
  Administered 2016-03-25: 3 mL via INTRAVENOUS

## 2016-03-23 MED ORDER — ONDANSETRON HCL 4 MG/2ML IJ SOLN: 4.0000 mg | Freq: Four times a day (QID) | INTRAMUSCULAR | Status: DC | PRN

## 2016-03-23 MED ORDER — SODIUM CHLORIDE 0.9 % IV SOLN
INTRAVENOUS | Status: DC
  Administered 2016-03-23 – 2016-03-25 (×5): via INTRAVENOUS

## 2016-03-23 MED ORDER — IBUPROFEN 600 MG PO TABS
ORAL_TABLET | ORAL | Status: AC
  Filled 2016-03-23: qty 1

## 2016-03-23 MED ORDER — ACETAMINOPHEN 650 MG RE SUPP: 650.0000 mg | Freq: Four times a day (QID) | RECTAL | Status: DC | PRN

## 2016-03-23 MED ORDER — ENOXAPARIN SODIUM 40 MG/0.4ML ~~LOC~~ SOLN
40.0000 mg | Freq: Two times a day (BID) | SUBCUTANEOUS | Status: DC
  Administered 2016-03-23 – 2016-03-24 (×3): 40 mg via SUBCUTANEOUS
  Filled 2016-03-23 (×5): qty 0.4

## 2016-03-23 NOTE — Progress Notes (Signed)
Pharmacist - Prescriber Communication  Enoxaparin dose modified to 40 mg subcutaneously BID for BMI greater than 40.  Broden Holt A. Willam[place]son, VermontPharm.D., BCPS Clinical Pharmacist 03/23/2016 0502

## 2016-03-23 NOTE — ED Notes (Signed)
Pt was assisted to the bathroom. 

## 2016-03-23 NOTE — Progress Notes (Signed)
Patient CBG 86, snack provided to patient.

## 2016-03-23 NOTE — ED Notes (Signed)
Patient transport to 142 

## 2016-03-23 NOTE — Progress Notes (Signed)
Pt had temp of 102.8 at approx 1630, received tylenol. Pt also asked for motrin. This Clinical research associatewriter paged Dr. Desiree HaneVachani, who answered, and order placed for motrin po. Pt's temp recheck was 100.8, pt then received motrin po at 1730, pt advised of order changes at that time including order for Ct head. Prime Doc later paged for order for pt to use his home cpap here overnight.

## 2016-03-23 NOTE — Progress Notes (Signed)
Received report from Vivian,RN. Pt resting in bed, in no acute distress. NS infusing to site in R fa, site free of redness and swelling. Call bell in reach.

## 2016-03-23 NOTE — Progress Notes (Signed)
[facility] - [place] at [facility]   PATIENT NAME: Jerry Sandoval    MR#:  [id number]  DATE OF BIRTH:  1965/12/04  SUBJECTIVE:  CHIEF COMPLAINT:   Chief Complaint  Patient presents with  . Fever  . Dizziness  . Cough    Came with c/o dizziness, fever.\He had URI symptoms with cough- was given oral augmentin for 5 days, still continue to have some diziness and fever with heaviness in his right ear.  Suspected to have acute bronchitis. Still have fever- denies headache, neck rigidity.  REVIEW OF SYSTEMS:  CONSTITUTIONAL: positive for fever, fatigue or weakness.  EYES: No blurred or double vision.  EARS, NOSE, AND THROAT: No tinnitus or ear pain.  RESPIRATORY: positive for cough,no shortness of breath, wheezing or hemoptysis.  CARDIOVASCULAR: No chest pain, orthopnea, edema.  GASTROINTESTINAL: No nausea, vomiting, diarrhea or abdominal pain.  GENITOURINARY: No dysuria, hematuria.  ENDOCRINE: No polyuria, nocturia,  HEMATOLOGY: No anemia, easy bruising or bleeding SKIN: No rash or lesion. MUSCULOSKELETAL: No joint pain or arthritis.   NEUROLOGIC: No tingling, numbness, weakness.  PSYCHIATRY: No anxiety or depression.   ROS  DRUG ALLERGIES:  No Known Allergies  VITALS:  Blood pressure 123/66, pulse (!) 115, temperature (!) 102 F (38.9 C), temperature source Oral, resp. rate 18, height 5\' 11"  (1.803 m), weight 135.2 kg (298 lb), SpO2 95 %.  PHYSICAL EXAMINATION:  GENERAL:  51 y.o.-year-old patient lying in the bed with no acute distress.  EYES: Pupils equal, round, reactive to light and accommodation. No scleral icterus. Extraocular muscles intact.  HEENT: Head atraumatic, normocephalic. Oropharynx and nasopharynx clear. Tenderness around right ear. NECK:  Supple, no jugular venous distention. No thyroid enlargement, no tenderness.  LUNGS: Normal breath sounds bilaterally, no wheezing, rales,rhonchi or crepitation. No use of accessory muscles of  respiration.  CARDIOVASCULAR: S1, S2 normal. No murmurs, rubs, or gallops.  ABDOMEN: Soft, nontender, nondistended. Bowel sounds present. No organomegaly or mass.  EXTREMITIES: No pedal edema, cyanosis, or clubbing.  NEUROLOGIC: Cranial nerves II through XII are intact. Muscle strength 5/5 in all extremities. Sensation intact. Gait not checked.  PSYCHIATRIC: The patient is alert and oriented x 3.  SKIN: No obvious rash, lesion, or ulcer.   Physical Exam LABORATORY PANEL:   CBC  Recent Labs Lab 03/23/16 0553  WBC 9.9  HGB 13.3  HCT 38.3*  PLT 219   ------------------------------------------------------------------------------------------------------------------  Chemistries   Recent Labs Lab 03/22/16 1835 03/23/16 0553  NA 133* 135  K 3.5 3.6  CL 98* 106  CO2 24 22  GLUCOSE 109* 126*  BUN 15 14  CREATININE 1.23 1.28*  CALCIUM 9.5 8.7*  AST 25  --   ALT 25  --   ALKPHOS 50  --   BILITOT 0.9  --    ------------------------------------------------------------------------------------------------------------------  Cardiac Enzymes  Recent Labs Lab 03/23/16 0553 03/23/16 1032  TROPONINI <0.03 <0.03   ------------------------------------------------------------------------------------------------------------------  RADIOLOGY:  Dg Chest 2 View  Result Date: 03/22/2016 CLINICAL DATA:  Productive cough for 1 week.  Fever and diarrhea. EXAM: CHEST  2 VIEW COMPARISON:  None. FINDINGS: The heart size and mediastinal contours are within normal limits. Both lungs are clear. Mild degenerative spurring noted within the thoracic spine. No acute or suspicious osseous finding. IMPRESSION: No active cardiopulmonary disease. No evidence of pneumonia or pulmonary edema. Electronically Signed   By: Bary Richard M.D.   On: 03/22/2016 19:11    ASSESSMENT AND PLAN:   Active Problems:   SIRS (systemic  inflammatory response syndrome) (HCC)   * SIRS   No clear source.   Suspected  to have acute bronchitis on admission, but pt said- his symptoms of cough and sputum are gettign better. He received 5 days oral augmentin.   Have pain around right ear- get CT head and ENT consult.   Will give levaquin for now.   Influenza negative.   Blood cultures are negative so far.  * Diabetes   Metformin. ISS.     All the records are reviewed and case discussed with Care Management/Social Workerr. Management plans discussed with the patient, family and they are in agreement.  CODE STATUS: Full.  TOTAL TIME TAKING CARE OF THIS PATIENT: 35 minutes.     POSSIBLE D/C IN 2 DAYS, DEPENDING ON CLINICAL CONDITION.   Altamese DillingVACHHANI, Eren Ryser M.D on 03/23/2016   Between 7am to 6pm - Pager - [phone]  After 6pm go to [email or web address] - password [email or web address]  [facility]  Office  [phone]  CC: Primary care physician; Danella PentonMark F Miller, MD  Note: This dictation was prepared with Dragon dictation along with smaller phrase technology. Any transcriptional errors that result from this process are unintentional.

## 2016-03-23 NOTE — Progress Notes (Signed)
Pharmacy Antibiotic Note  Jerry FootmanChristopher W Armbrust is a 51 y.o. male admitted on 03/22/2016 with SIRS and possible Bronchitis. Pharmacy has been consulted for Levofloxacin  Dosing. Patient received Azithromycin 500mg  x1 dose today.   Plan: Will start patient on Levofloxacin 500mg  PO daily. Levofloxacin and Azithromycin have been ordered concurrently in the past 18 hours. These drugs should not be administered together due to risk of QT interval prolongation and duplication of coverage.  Since antibiotic therapy is changing,  the start time of the active antibiotic is to start to 24 hours after the previous administered dose.  Height: 5\' 11"  (180.3 cm) Weight: 298 lb (135.2 kg) IBW/kg (Calculated) : 75.3  Temp (24hrs), Avg:100.7 F (38.2 C), Min:98.4 F (36.9 C), Max:102.9 F (39.4 C)   Recent Labs Lab 03/22/16 1835 03/23/16 0553  WBC 12.6* 9.9  CREATININE 1.23 1.28*  LATICACIDVEN 1.9  --     Estimated Creatinine Clearance: 99.1 mL/min (by C-G formula based on SCr of 1.28 mg/dL (H)).    No Known Allergies  Antimicrobials this admission: 2/5 Azithromycin  X 1 dose 2/5 levofloxacin >>  Dose adjustments this admission:  Microbiology results: 2/4  BCx: NG x 2 day 2/4 UCx: sent  Thank you for allowing pharmacy to be a part of this patient's care.  Gardner CandleSheema M Marshay Slates, PharmD, BCPS Clinical Pharmacist 03/23/2016 5:12 PM

## 2016-03-23 NOTE — ED Provider Notes (Signed)
-----------------------------------------   3:27 AM on 03/23/2016 -----------------------------------------   Blood pressure 137/78, pulse (!) 128, temperature (!) 102.9 F (39.4 C), temperature source Oral, resp. rate 17, weight 296 lb (134.3 kg), SpO2 94 %.  Assuming care from Dr. Don PerkingVeronese.  In short, Jerry Sandoval is a 51 y.o. male with a chief complaint of Fever; Dizziness; and Cough .  Refer to the original H&P for additional details.  The current plan of care is to reevaluate the patient after his third liter of normal saline.      The patient remained tachycardic while in the emergency department. He did have a temperature checked and it was found to be 102.8. I did give the patient 600 mg of ibuprofen but he again remained tachycardic. He received his third liter. Since he still tachycardic and he still febrile I will give him another gram of Tylenol and I will admit him to the hospitalist service.   Rebecka ApleyAllison P Webster, MD 03/23/16 [phone]

## 2016-03-23 NOTE — Progress Notes (Signed)
[place] rounding the unit was asked to visit with the Pt. [place] met Pt with his wife at bedside. CH appeared anxious because the Dc do not know he has an abnormal heart rate. Pt expressed that he would like to go home, but was still waiting for more tests to be done to determine what was going on before he is discharged. Pt was looking forward to be discharged tomorrow. CH offered to pray for the Pt's concerns and for his wife, which the Pt agreed to have. CH also informed the Pt and his wife that he is available if they needed him.     03/23/16 1400  Clinical Encounter Type  Visited With Patient;Family  Visit Type Initial;Spiritual support  Referral From Nurse  Consult/Referral To Chaplain  Spiritual Encounters  Spiritual Needs Prayer

## 2016-03-23 NOTE — H&P (Addendum)
[facility] - [place] at [facility]   PATIENT NAME: Jerry Sandoval    MR#:  [id number]  DATE OF BIRTH:  11-28-1965  DATE OF ADMISSION:  03/22/2016  PRIMARY CARE PHYSICIAN: Danella Penton, MD   REQUESTING/REFERRING PHYSICIAN:   CHIEF COMPLAINT:   Chief Complaint  Patient presents with  . Fever  . Dizziness  . Cough    HISTORY OF PRESENT ILLNESS: Jerry Sandoval  is a 51 y.o. male with a known history of Arthritis, diabetes mellitus type 2, hyperlipidemia, hypertension, sleep apnea presented to the emergency room with fever and cough and congestion. Patient has the symptoms for the last few days. He had fever of 103F and a lot of sweats. He was seen by primary care physician started on oral Augmentin. He had generalized weakness and fatigue. Patient was evaluated in the emergency room was found to be tachycardic and heart rate of 140 bpm. his bedside lactate level was normal and chest x-ray did not reveal any pneumonia. No sick contacts at home. Patient was started on IV fluids in the emergency room and hospitalist service was consulted for further care of the patient. No complaints of any chest pain, shortness of breath and orthopnea.  PAST MEDICAL HISTORY:   Past Medical History:  Diagnosis Date  . Arteriovenous malformation of cerebral vessels   . Arthritis   . Asthma   . Carpal tunnel syndrome   . Diabetes mellitus without complication (HCC)   . Hyperlipidemia   . Hypertension   . Sleep apnea     PAST SURGICAL HISTORY: Past Surgical History:  Procedure Laterality Date  . CEREBRAL ANGIOGRAM    . COLONOSCOPY WITH PROPOFOL N/A 07/09/2015   Procedure: COLONOSCOPY WITH PROPOFOL;  Surgeon: Christena Deem, MD;  Location: [facility] ENDOSCOPY;  Service: Endoscopy;  Laterality: N/A;  . WISDOM TOOTH EXTRACTION      SOCIAL HISTORY:  Social History  Substance Use Topics  . Smoking status: Former Games developer  . Smokeless tobacco: Current User  . Alcohol use  No    FAMILY HISTORY: History reviewed. No pertinent family history.  Mother Alive : healthy Father : Alive No history of hypertension, diabetes, cancer, heart disease. DRUG ALLERGIES: No Known Allergies  REVIEW OF SYSTEMS:   CONSTITUTIONAL: Has fever, weakness.  EYES: No blurred or double vision.  EARS, NOSE, AND THROAT: No tinnitus or ear pain.  RESPIRATORY: Has cough, shortness of breath,  No wheezing or hemoptysis.  CARDIOVASCULAR: No chest pain, orthopnea, edema.  GASTROINTESTINAL: No nausea, vomiting, diarrhea or abdominal pain.  GENITOURINARY: No dysuria, hematuria.  ENDOCRINE: No polyuria, nocturia,  HEMATOLOGY: No anemia, easy bruising or bleeding SKIN: No rash or lesion. MUSCULOSKELETAL: No joint pain or arthritis.   NEUROLOGIC: No tingling, numbness, weakness.  PSYCHIATRY: No anxiety or depression.   MEDICATIONS AT HOME:  Prior to Admission medications   Medication Sig Start Date End Date Taking? Authorizing Provider  albuterol (PROVENTIL HFA;VENTOLIN HFA) 108 (90 Base) MCG/ACT inhaler Inhale 2 puffs into the lungs every 6 (six) hours as needed for wheezing or shortness of breath.    Historical Provider, MD  allopurinol (ZYLOPRIM) 100 MG tablet Take 100 mg by mouth daily.    Historical Provider, MD  allopurinol (ZYLOPRIM) 300 MG tablet Take 300 mg by mouth daily.    Historical Provider, MD  cetirizine (ZYRTEC) 10 MG tablet Take 10 mg by mouth daily.    Historical Provider, MD  cyclobenzaprine (FLEXERIL) 10 MG tablet Take 10 mg by mouth  2 (two) times daily as needed for muscle spasms.    Historical Provider, MD  diazepam (VALIUM) 5 MG tablet Take 5 mg by mouth daily.    Historical Provider, MD  indomethacin (INDOCIN) 50 MG capsule Take 50 mg by mouth 2 (two) times daily with a meal.    Historical Provider, MD  lisinopril-hydrochlorothiazide (PRINZIDE,ZESTORETIC) 20-12.5 MG tablet Take 1 tablet by mouth daily.    Historical Provider, MD  magnesium oxide (MAG-OX) 400 MG  tablet Take 400 mg by mouth daily.    Historical Provider, MD  metFORMIN (GLUCOPHAGE) 500 MG tablet Take 1,000 mg by mouth 2 (two) times daily with a meal.    Historical Provider, MD  topiramate (TOPAMAX) 50 MG tablet Take 50 mg by mouth 2 (two) times daily.    Historical Provider, MD  Vitamin D, Ergocalciferol, (DRISDOL) 50000 units CAPS capsule Take 50,000 Units by mouth every 7 (seven) days.    Historical Provider, MD      PHYSICAL EXAMINATION:   VITAL SIGNS: Blood pressure 137/78, pulse (!) 128, temperature (!) 102.9 F (39.4 C), temperature source Oral, resp. rate 17, weight 134.3 kg (296 lb), SpO2 94 %.  GENERAL:  51 y.o.-year-old patient lying in the bed with no acute distress.  EYES: Pupils equal, round, reactive to light and accommodation. No scleral icterus. Extraocular muscles intact.  HEENT: Head atraumatic, normocephalic. Oropharynx dry and nasopharynx clear.  NECK:  Supple, no jugular venous distention. No thyroid enlargement, no tenderness.  LUNGS: Normal breath sounds bilaterally, no wheezing, rales,rhonchi or crepitation. No use of accessory muscles of respiration.  CARDIOVASCULAR: S1, S2 Tachycardia noted. No murmurs, rubs, or gallops.  ABDOMEN: Soft, nontender, nondistended. Bowel sounds present. No organomegaly or mass.  EXTREMITIES: No pedal edema, cyanosis, or clubbing.  NEUROLOGIC: Cranial nerves II through XII are intact. Muscle strength 5/5 in all extremities. Sensation intact. Gait not checked.  PSYCHIATRIC: The patient is alert and oriented x 3.  SKIN: No obvious rash, lesion, or ulcer.   LABORATORY PANEL:   CBC  Recent Labs Lab 03/22/16 1835  WBC 12.6*  HGB 15.0  HCT 45.3  PLT 279  MCV 91.1  MCH 30.1  MCHC 33.0  RDW 14.4  LYMPHSABS 2.5  MONOABS 1.0  EOSABS 0.3  BASOSABS 0.1   ------------------------------------------------------------------------------------------------------------------  Chemistries   Recent Labs Lab 03/22/16 1835  NA  133*  K 3.5  CL 98*  CO2 24  GLUCOSE 109*  BUN 15  CREATININE 1.23  CALCIUM 9.5  AST 25  ALT 25  ALKPHOS 50  BILITOT 0.9   ------------------------------------------------------------------------------------------------------------------ CrCl cannot be calculated (Unknown ideal weight.). ------------------------------------------------------------------------------------------------------------------ No results for input(s): TSH, T4TOTAL, T3FREE, THYROIDAB in the last 72 hours.  Invalid input(s): FREET3   Coagulation profile  Recent Labs Lab 03/22/16 1835  INR 1.04   ------------------------------------------------------------------------------------------------------------------- No results for input(s): DDIMER in the last 72 hours. -------------------------------------------------------------------------------------------------------------------  Cardiac Enzymes No results for input(s): CKMB, TROPONINI, MYOGLOBIN in the last 168 hours.  Invalid input(s): CK ------------------------------------------------------------------------------------------------------------------ Invalid input(s): POCBNP  ---------------------------------------------------------------------------------------------------------------  Urinalysis    Component Value Date/Time   COLORURINE STRAW (A) 03/22/2016 2119   APPEARANCEUR CLEAR (A) 03/22/2016 2119   LABSPEC 1.008 03/22/2016 2119   PHURINE 5.0 03/22/2016 2119   GLUCOSEU NEGATIVE 03/22/2016 2119   HGBUR SMALL (A) 03/22/2016 2119   BILIRUBINUR NEGATIVE 03/22/2016 2119   KETONESUR NEGATIVE 03/22/2016 2119   PROTEINUR NEGATIVE 03/22/2016 2119   NITRITE NEGATIVE 03/22/2016 2119   LEUKOCYTESUR NEGATIVE 03/22/2016 2119  RADIOLOGY: Dg Chest 2 View  Result Date: 03/22/2016 CLINICAL DATA:  Productive cough for 1 week.  Fever and diarrhea. EXAM: CHEST  2 VIEW COMPARISON:  None. FINDINGS: The heart size and mediastinal contours are within  normal limits. Both lungs are clear. Mild degenerative spurring noted within the thoracic spine. No acute or suspicious osseous finding. IMPRESSION: No active cardiopulmonary disease. No evidence of pneumonia or pulmonary edema. Electronically Signed   By: Bary RichardStan  Maynard M.D.   On: 03/22/2016 19:11    EKG: Orders placed or performed during the hospital encounter of 03/22/16  . ED EKG  . ED EKG    IMPRESSION AND PLAN: 51 year old male patient history of diabetes mellitus type 2, hypertension, hyperlipidemia, sleep apnea presented to the emergency room with fever, cough and congestion. Admitting diagnosis 1. Systemic inflammatory response syndrome 2. Acute bronchitis 3. Tachycardia 4. Hypokalemia 5. Hyponatremia 6. Dehydration Treatment plan Admit patient to telemetry IV fluid hydration Start patient on oral Zithromax antibiotic Replace electrolytes Follow-up sodium level Check for cultures Supportive care.  All the records are reviewed and case discussed with ED provider. Management plans discussed with the patient, family and they are in agreement.  CODE STATUS:FULL CODE Code Status History    This patient does not have a recorded code status. Please follow your organizational policy for patients in this situation.       TOTAL TIME TAKING CARE OF THIS PATIENT: 52 minutes.    Ihor AustinPavan Pyreddy M.D on 03/23/2016 at 3:33 AM  Between 7am to 6pm - Pager - [phone]  After 6pm go to [email or web address] - password [email or web address]  [facility]  Office  [phone]  CC: Primary care physician; Danella PentonMark F Miller, MD

## 2016-03-23 NOTE — ED Notes (Signed)
Pt reports 2-3 days of flu like s/sx and fever that won't respond to tylenol, no hx of travel or known exposure to infections, pt also reports pain in right ear, exam revealss redness in right middle ear

## 2016-03-24 DIAGNOSIS — E860 Dehydration: Secondary | ICD-10-CM | POA: Diagnosis present

## 2016-03-24 DIAGNOSIS — Z720 Tobacco use: Secondary | ICD-10-CM | POA: Diagnosis not present

## 2016-03-24 DIAGNOSIS — Z7984 Long term (current) use of oral hypoglycemic drugs: Secondary | ICD-10-CM | POA: Diagnosis not present

## 2016-03-24 DIAGNOSIS — G4730 Sleep apnea, unspecified: Secondary | ICD-10-CM | POA: Diagnosis present

## 2016-03-24 DIAGNOSIS — I10 Essential (primary) hypertension: Secondary | ICD-10-CM | POA: Diagnosis present

## 2016-03-24 DIAGNOSIS — Q283 Other malformations of cerebral vessels: Secondary | ICD-10-CM | POA: Diagnosis not present

## 2016-03-24 DIAGNOSIS — H6691 Otitis media, unspecified, right ear: Secondary | ICD-10-CM | POA: Diagnosis present

## 2016-03-24 DIAGNOSIS — E785 Hyperlipidemia, unspecified: Secondary | ICD-10-CM | POA: Diagnosis present

## 2016-03-24 DIAGNOSIS — R000 Tachycardia, unspecified: Secondary | ICD-10-CM | POA: Diagnosis present

## 2016-03-24 DIAGNOSIS — Z6841 Body Mass Index (BMI) 40.0 and over, adult: Secondary | ICD-10-CM | POA: Diagnosis not present

## 2016-03-24 DIAGNOSIS — E871 Hypo-osmolality and hyponatremia: Secondary | ICD-10-CM | POA: Diagnosis present

## 2016-03-24 DIAGNOSIS — E119 Type 2 diabetes mellitus without complications: Secondary | ICD-10-CM | POA: Diagnosis present

## 2016-03-24 DIAGNOSIS — E876 Hypokalemia: Secondary | ICD-10-CM | POA: Diagnosis present

## 2016-03-24 DIAGNOSIS — E669 Obesity, unspecified: Secondary | ICD-10-CM | POA: Diagnosis present

## 2016-03-24 DIAGNOSIS — J069 Acute upper respiratory infection, unspecified: Secondary | ICD-10-CM | POA: Diagnosis present

## 2016-03-24 DIAGNOSIS — J45909 Unspecified asthma, uncomplicated: Secondary | ICD-10-CM | POA: Diagnosis present

## 2016-03-24 LAB — CBC
HCT: 40.2 % (ref 40.0–52.0)
Hemoglobin: 13.6 g/dL (ref 13.0–18.0)
MCH: 31.2 pg (ref 26.0–34.0)
MCHC: 33.9 g/dL (ref 32.0–36.0)
MCV: 92 fL (ref 80.0–100.0)
Platelets: 224 10*3/uL (ref 150–440)
RBC: 4.37 MIL/uL — ABNORMAL LOW (ref 4.40–5.90)
RDW: 14.6 % — ABNORMAL HIGH (ref 11.5–14.5)
WBC: 6.9 10*3/uL (ref 3.8–10.6)

## 2016-03-24 LAB — GLUCOSE, CAPILLARY
Glucose-Capillary: 106 mg/dL — ABNORMAL HIGH (ref 65–99)
Glucose-Capillary: 80 mg/dL (ref 65–99)
Glucose-Capillary: 93 mg/dL (ref 65–99)
Glucose-Capillary: 100 mg/dL — ABNORMAL HIGH (ref 65–99)

## 2016-03-24 LAB — BASIC METABOLIC PANEL
Anion gap: 9 (ref 5–15)
BUN: 15 mg/dL (ref 6–20)
CO2: 26 mmol/L (ref 22–32)
Calcium: 9.2 mg/dL (ref 8.9–10.3)
Chloride: 104 mmol/L (ref 101–111)
Creatinine, Ser: 1.08 mg/dL (ref 0.61–1.24)
GFR calc Af Amer: >60 mL/min (ref >60)
GFR calc non Af Amer: >60 mL/min (ref >60)
Glucose, Bld: 103 mg/dL — ABNORMAL HIGH (ref 65–99)
Potassium: 3.7 mmol/L (ref 3.5–5.1)
Sodium: 139 mmol/L (ref 135–145)

## 2016-03-24 LAB — RAPID HIV SCREEN (HIV 1/2 AB+AG)
HIV 1/2 Antibodies: NONREACTIVE
HIV-1 P24 Antigen - HIV24: NONREACTIVE

## 2016-03-24 LAB — URINE CULTURE: Culture: NO GROWTH

## 2016-03-24 LAB — PROCALCITONIN: Procalcitonin: <0.1 ng/mL

## 2016-03-24 NOTE — Consult Note (Signed)
[facility] Infectious Disease     Reason for Consult:FUO    Referring Physician: Dolores Frame Date of Admission:  03/22/2016   Active Problems:   SIRS (systemic inflammatory response syndrome) ([place])   HPI: ANTWYNE PINGREE is a 51 y.o. male admitted with fever, cough and congestion. On admit his temp 103, wbc 12.6, Flu PCR neg, UA nml cxr neg, CT head negative.  Started on levofloxacin and also received one dose azithromycin.  He reports he started with a ST then a cough 1/25 which persists. Did not start fevers until about 1/31. Has had fevers, cough and HA since then. Also mild diarrhea but no abd pain, nv. Has no appetite and lost a few pounds. No rash or joint pain. No dysuria or flank pain. No photophobia or neck stiffness. 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 His wife is sick now starting today with fevers and cough.  No recent procedures, dental work, infections or skin lesions or boils.   Past Medical History:  Diagnosis Date  . Arteriovenous malformation of cerebral vessels   . Arthritis   . Asthma   . Carpal tunnel syndrome   . Diabetes mellitus without complication ([place])   . Hyperlipidemia   . Hypertension   . Sleep apnea    Past Surgical History:  Procedure Laterality Date  . CEREBRAL ANGIOGRAM    . COLONOSCOPY WITH PROPOFOL N/A 07/09/2015   Procedure: COLONOSCOPY WITH PROPOFOL;  Surgeon: Lollie Sails, MD;  Location: [facility] ENDOSCOPY;  Service: Endoscopy;  Laterality: N/A;  . WISDOM TOOTH EXTRACTION     Social History  Substance Use Topics  . Smoking status: Former Research scientist (life sciences)  . Smokeless tobacco: Current User  . Alcohol use No   Family History  Problem Relation Age of Onset  . Hypertension Neg Hx   . Diabetes Neg Hx   . CAD Neg Hx     Allergies: No Known Allergies  Current antibiotics: Antibiotics Given (last 72 hours)    Date/Time Action Medication Dose   03/23/16 0837 Given   azithromycin (ZITHROMAX) tablet 500 mg 500 mg    03/24/16 0805 Given   levofloxacin (LEVAQUIN) tablet 500 mg 500 mg      MEDICATIONS: . allopurinol  300 mg Oral Daily  . diazepam  5 mg Oral Daily  . enoxaparin (LOVENOX) injection  40 mg Subcutaneous BID  . insulin aspart  0-15 Units Subcutaneous TID WC  . insulin aspart  0-5 Units Subcutaneous QHS  . levofloxacin  500 mg Oral Daily  . loratadine  10 mg Oral Daily  . magnesium oxide  400 mg Oral Daily  . metFORMIN  1,000 mg Oral BID WC  . sodium chloride flush  3 mL Intravenous Q12H  . topiramate  50 mg Oral BID  . Vitamin D (Ergocalciferol)  50,000 Units Oral Q7 days    Review of Systems - 11 systems reviewed and negative per HPI   OBJECTIVE: Temp:  [98.7 F (37.1 C)-102 F (38.9 C)] 99.7 F (37.6 C) (02/06 0900) Pulse Rate:  [91-115] 103 (02/06 0730) Resp:  [18-19] 18 (02/06 0730) BP: (112-136)/(62-80) 124/78 (02/06 0730) SpO2:  [94 %-98 %] 94 % (02/06 0730) Physical Exam  Constitutional: obese He is oriented to person, place, and time. He appears well-developed and well-nourished. No distress.  HENT: anicteric,  Mouth/Throat: Oropharynx is clear and moist. No oropharyngeal exudate.  Cardiovascular:Tachy but reg Pulmonary/Chest: bil rhonchi and wheeze Abdominal: Soft. Obese Bowel sounds are normal.  He exhibits no distension. There is no tenderness.  Lymphadenopathy: He has no cervical adenopathy.  Neurological: He is alert and oriented to person, place, and time.  Skin: Skin is warm and dry. No rash noted. No erythema.  Psychiatric: He has a normal mood and affect. His behavior is normal.  Ext, no cce   LABS: Results for orders placed or performed during the hospital encounter of 03/22/16 (from the past 48 hour(s))  Glucose, capillary     Status: Abnormal   Collection Time: 03/22/16  6:30 PM  Result Value Ref Range   Glucose-Capillary 120 (H) 65 - 99 mg/dL  Culture, blood (Routine x 2)     Status: None (Preliminary result)   Collection Time: 03/22/16  6:34 PM   Result Value Ref Range   Specimen Description BLOOD RIGHT FOREARM    Special Requests BOTTLES DRAWN AEROBIC AND ANAEROBIC [place]    Culture NO GROWTH 2 DAYS    Report Status PENDING   Comprehensive metabolic panel     Status: Abnormal   Collection Time: 03/22/16  6:35 PM  Result Value Ref Range   Sodium 133 (L) 135 - 145 mmol/L   Potassium 3.5 3.5 - 5.1 mmol/L   Chloride 98 (L) 101 - 111 mmol/L   CO2 24 22 - 32 mmol/L   Glucose, Bld 109 (H) 65 - 99 mg/dL   BUN 15 6 - 20 mg/dL   Creatinine, Ser 1.23 0.61 - 1.24 mg/dL   Calcium 9.5 8.9 - 10.3 mg/dL   Total Protein 8.2 (H) 6.5 - 8.1 g/dL   Albumin 4.8 3.5 - 5.0 g/dL   AST 25 15 - 41 U/L   ALT 25 17 - 63 U/L   Alkaline Phosphatase 50 38 - 126 U/L   Total Bilirubin 0.9 0.3 - 1.2 mg/dL   GFR calc non Af Amer >60 >60 mL/min   GFR calc Af Amer >60 >60 mL/min    Comment: (NOTE) The eGFR has been calculated using the CKD EPI equation. This calculation has not been validated in all clinical situations. eGFR's persistently <60 mL/min signify possible Chronic Kidney Disease.    Anion gap 11 5 - 15  Lactic acid, plasma     Status: None   Collection Time: 03/22/16  6:35 PM  Result Value Ref Range   Lactic Acid, Venous 1.9 0.5 - 1.9 mmol/L  CBC with Differential     Status: Abnormal   Collection Time: 03/22/16  6:35 PM  Result Value Ref Range   WBC 12.6 (H) 3.8 - 10.6 K/uL   RBC 4.97 4.40 - 5.90 MIL/uL   Hemoglobin 15.0 13.0 - 18.0 g/dL   HCT 45.3 40.0 - 52.0 %   MCV 91.1 80.0 - 100.0 fL   MCH 30.1 26.0 - 34.0 pg   MCHC 33.0 32.0 - 36.0 g/dL   RDW 14.4 11.5 - 14.5 %   Platelets 279 150 - 440 K/uL   Neutrophils Relative % 69 %   Neutro Abs 8.7 (H) 1.4 - 6.5 K/uL   Lymphocytes Relative 20 %   Lymphs Abs 2.5 1.0 - 3.6 K/uL   Monocytes Relative 8 %   Monocytes Absolute 1.0 0.2 - 1.0 K/uL   Eosinophils Relative 2 %   Eosinophils Absolute 0.3 0 - 0.7 K/uL   Basophils Relative 1 %   Basophils Absolute 0.1 0 - 0.1 K/uL   Protime-INR     Status: None   Collection Time: 03/22/16  6:35 PM  Result Value  Ref Range   Prothrombin Time 13.6 11.4 - 15.2 seconds   INR 1.04   Culture, blood (Routine x 2)     Status: None (Preliminary result)   Collection Time: 03/22/16  6:35 PM  Result Value Ref Range   Specimen Description BLOOD  R F ARM    Special Requests BOTTLES DRAWN AEROBIC AND ANAEROBIC  10MLS    Culture NO GROWTH 2 DAYS    Report Status PENDING   Influenza panel by PCR (type A & B)     Status: None   Collection Time: 03/22/16  6:35 PM  Result Value Ref Range   Influenza A By PCR NEGATIVE NEGATIVE   Influenza B By PCR NEGATIVE NEGATIVE    Comment: (NOTE) The Xpert Xpress Flu assay is intended as an aid in the diagnosis of  influenza and should not be used as a sole basis for treatment.  This  assay is FDA approved for nasopharyngeal swab specimens only. Nasal  washings and aspirates are unacceptable for Xpert Xpress Flu testing.   Ethanol     Status: None   Collection Time: 03/22/16  6:35 PM  Result Value Ref Range   Alcohol, Ethyl (B) <5 <5 mg/dL    Comment:        LOWEST DETECTABLE LIMIT FOR SERUM ALCOHOL IS 5 mg/dL FOR MEDICAL PURPOSES ONLY   Urinalysis, Complete w Microscopic     Status: Abnormal   Collection Time: 03/22/16  9:19 PM  Result Value Ref Range   Color, Urine STRAW (A) YELLOW   APPearance CLEAR (A) CLEAR   Specific Gravity, Urine 1.008 1.005 - 1.030   pH 5.0 5.0 - 8.0   Glucose, UA NEGATIVE NEGATIVE mg/dL   Hgb urine dipstick SMALL (A) NEGATIVE   Bilirubin Urine NEGATIVE NEGATIVE   Ketones, ur NEGATIVE NEGATIVE mg/dL   Protein, ur NEGATIVE NEGATIVE mg/dL   Nitrite NEGATIVE NEGATIVE   Leukocytes, UA NEGATIVE NEGATIVE   RBC / HPF 0-5 0 - 5 RBC/hpf   WBC, UA 0-5 0 - 5 WBC/hpf   Bacteria, UA NONE SEEN NONE SEEN   Squamous Epithelial / LPF NONE SEEN NONE SEEN   Mucous PRESENT   Urine culture     Status: None   Collection Time: 03/22/16  9:19 PM  Result Value Ref Range    Specimen Description URINE, RANDOM    Special Requests NONE    Culture      NO GROWTH Performed at [facility], [address] [place], [place] [address]    Report Status 03/24/2016 FINAL   CBC     Status: Abnormal   Collection Time: 03/23/16  5:53 AM  Result Value Ref Range   WBC 9.9 3.8 - 10.6 K/uL   RBC 4.25 (L) 4.40 - 5.90 MIL/uL   Hemoglobin 13.3 13.0 - 18.0 g/dL   HCT 38.3 (L) 40.0 - 52.0 %   MCV 90.2 80.0 - 100.0 fL   MCH 31.4 26.0 - 34.0 pg   MCHC 34.8 32.0 - 36.0 g/dL   RDW 14.0 11.5 - 14.5 %   Platelets 219 150 - 440 K/uL  Troponin I     Status: None   Collection Time: 03/23/16  5:53 AM  Result Value Ref Range   Troponin I <0.03 <0.03 ng/mL  Basic metabolic panel     Status: Abnormal   Collection Time: 03/23/16  5:53 AM  Result Value Ref Range   Sodium 135 135 - 145 mmol/L   Potassium  3.6 3.5 - 5.1 mmol/L   Chloride 106 101 - 111 mmol/L   CO2 22 22 - 32 mmol/L   Glucose, Bld 126 (H) 65 - 99 mg/dL   BUN 14 6 - 20 mg/dL   Creatinine, Ser 1.28 (H) 0.61 - 1.24 mg/dL   Calcium 8.7 (L) 8.9 - 10.3 mg/dL   GFR calc non Af Amer >60 >60 mL/min   GFR calc Af Amer >60 >60 mL/min    Comment: (NOTE) The eGFR has been calculated using the CKD EPI equation. This calculation has not been validated in all clinical situations. eGFR's persistently <60 mL/min signify possible Chronic Kidney Disease.    Anion gap 7 5 - 15  Glucose, capillary     Status: Abnormal   Collection Time: 03/23/16  7:28 AM  Result Value Ref Range   Glucose-Capillary 129 (H) 65 - 99 mg/dL  Troponin I     Status: None   Collection Time: 03/23/16 10:32 AM  Result Value Ref Range   Troponin I <0.03 <0.03 ng/mL  Glucose, capillary     Status: None   Collection Time: 03/23/16 11:16 AM  Result Value Ref Range   Glucose-Capillary 90 65 - 99 mg/dL  Glucose, capillary     Status: Abnormal   Collection Time: 03/23/16  4:14 PM  Result Value Ref Range   Glucose-Capillary 111 (H) 65 - 99 mg/dL   Troponin I     Status: None   Collection Time: 03/23/16  4:32 PM  Result Value Ref Range   Troponin I <0.03 <0.03 ng/mL  Glucose, capillary     Status: None   Collection Time: 03/23/16  9:11 PM  Result Value Ref Range   Glucose-Capillary 86 65 - 99 mg/dL   Comment 1 Notify RN   CBC     Status: Abnormal   Collection Time: 03/24/16  4:16 AM  Result Value Ref Range   WBC 6.9 3.8 - 10.6 K/uL   RBC 4.37 (L) 4.40 - 5.90 MIL/uL   Hemoglobin 13.6 13.0 - 18.0 g/dL   HCT 40.2 40.0 - 52.0 %   MCV 92.0 80.0 - 100.0 fL   MCH 31.2 26.0 - 34.0 pg   MCHC 33.9 32.0 - 36.0 g/dL   RDW 14.6 (H) 11.5 - 14.5 %   Platelets 224 150 - 440 K/uL  Basic metabolic panel     Status: Abnormal   Collection Time: 03/24/16  4:16 AM  Result Value Ref Range   Sodium 139 135 - 145 mmol/L   Potassium 3.7 3.5 - 5.1 mmol/L   Chloride 104 101 - 111 mmol/L   CO2 26 22 - 32 mmol/L   Glucose, Bld 103 (H) 65 - 99 mg/dL   BUN 15 6 - 20 mg/dL   Creatinine, Ser 1.08 0.61 - 1.24 mg/dL   Calcium 9.2 8.9 - 10.3 mg/dL   GFR calc non Af Amer >60 >60 mL/min   GFR calc Af Amer >60 >60 mL/min    Comment: (NOTE) The eGFR has been calculated using the CKD EPI equation. This calculation has not been validated in all clinical situations. eGFR's persistently <60 mL/min signify possible Chronic Kidney Disease.    Anion gap 9 5 - 15  Glucose, capillary     Status: Abnormal   Collection Time: 03/24/16  7:30 AM  Result Value Ref Range   Glucose-Capillary 106 (H) 65 - 99 mg/dL   Comment 1 Notify RN    Comment 2 Document in Chart  No components found for: ESR, C REACTIVE PROTEIN MICRO: Recent Results (from the past 720 hour(s))  Culture, blood (Routine x 2)     Status: None (Preliminary result)   Collection Time: 03/22/16  6:34 PM  Result Value Ref Range Status   Specimen Description BLOOD RIGHT FOREARM  Final   Special Requests BOTTLES DRAWN AEROBIC AND ANAEROBIC [place]  Final   Culture NO GROWTH 2 DAYS  Final    Report Status PENDING  Incomplete  Culture, blood (Routine x 2)     Status: None (Preliminary result)   Collection Time: 03/22/16  6:35 PM  Result Value Ref Range Status   Specimen Description BLOOD  R F ARM  Final   Special Requests BOTTLES DRAWN AEROBIC AND ANAEROBIC  10MLS  Final   Culture NO GROWTH 2 DAYS  Final   Report Status PENDING  Incomplete  Urine culture     Status: None   Collection Time: 03/22/16  9:19 PM  Result Value Ref Range Status   Specimen Description URINE, RANDOM  Final   Special Requests NONE  Final   Culture   Final    NO GROWTH Performed at [facility], [place] [address], [place], [place] [address]    Report Status 03/24/2016 FINAL  Final    IMAGING: Dg Chest 2 View  Result Date: 03/22/2016 CLINICAL DATA:  Productive cough for 1 week.  Fever and diarrhea. EXAM: CHEST  2 VIEW COMPARISON:  None. FINDINGS: The heart size and mediastinal contours are within normal limits. Both lungs are clear. Mild degenerative spurring noted within the thoracic spine. No acute or suspicious osseous finding. IMPRESSION: No active cardiopulmonary disease. No evidence of pneumonia or pulmonary edema. Electronically Signed   By: Franki Cabot M.D.   On: 03/22/2016 19:11   Ct Head Wo Contrast  Result Date: 03/23/2016 CLINICAL DATA:  Dizziness and fever.  Headache. EXAM: CT HEAD WITHOUT CONTRAST TECHNIQUE: Contiguous axial images were obtained from the base of the skull through the vertex without intravenous contrast. COMPARISON:  Brain MRI May 08, 2015 FINDINGS: Brain: The ventricles are normal in size and configuration. There is no intracranial mass, hemorrhage, extra-axial fluid collection, or midline shift. No focal gray-white compartment lesions are identified. No acute infarct evident. Vascular: The venous angioma noted in the right parietal lobe on recent MR is apparent on CT but much less apparent than on MR. there is no hyperdense vessel. No vascular calcification is evident.  Skull: The bony calvarium appears intact. Sinuses/Orbits: There is mucosal thickening in the periphery of the visualized right maxillary antrum. There is mild mucosal thickening in several ethmoid air cells bilaterally. There is slight mucosal thickening in the posterior right sphenoid sinus. Visualized paranasal sinuses elsewhere clear. Visualized orbits appear symmetric bilaterally. Other: Visualized mastoid air cells are clear. 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 no intracranial mass, hemorrhage, or extra-axial fluid collection. No evidence of acute infarct. Areas of relatively mild paranasal sinus disease noted. Electronically Signed   By: Lowella Grip III M.D.   On: 03/23/2016 20:04    Assessment:   JOEZIAH VOIT is a 51 y.o. male with fevers for about one week with prior week of cough and a ST. He has only mildly elevated wbc, lfts nml, UA nml, cxr neg. HIV neg, Flu PCR negCT head shows known venous angioma but otherwise negative. I suspect a viral infection despite neg flu.  His wife is sick now with fever and  cough.  No evidence on exam of meningitis.   Recommendations Cont levo Check procalcitonin. Monitor fever and if recurs repeat bcx. I suspect he can be dced tomorrow if cultures all negative. If worsens would consider CT chest Thank you very much for allowing me to participate in the care of this patient. Please call with questions.   Cheral Marker. Ola Spurr, MD

## 2016-03-24 NOTE — Progress Notes (Signed)
[facility] [place] [facility]   PATIENT NAME: Jerry Sandoval    MR#:  [id number]  DATE OF BIRTH:  Jul 09, 1965  SUBJECTIVE:  CHIEF COMPLAINT:   Chief Complaint  Patient presents with  . Fever  . Dizziness  . Cough    Came with c/o dizziness, fever.\He had URI symptoms with cough- was given oral augmentin for 5 days, still continue to have some diziness and fever with heaviness in his right ear.  In past he had few episodes of vertigo and dizziness- MRI brain was done with some vascular malformations, referred to neurologist Dr.Shah in office, no clear reasons found- but he was doing fine for last few months.  Suspected to have acute bronchitis. Still have fever- denies headache, neck rigidity.   He still have fever. CT head is with some thickening on ethmoidal sinuses.  REVIEW OF SYSTEMS:  CONSTITUTIONAL: positive for fever, fatigue or weakness.  EYES: No blurred or double vision.  EARS, NOSE, AND THROAT: No tinnitus or ear pain.  RESPIRATORY: positive for cough,no shortness of breath, wheezing or hemoptysis.  CARDIOVASCULAR: No chest pain, orthopnea, edema.  GASTROINTESTINAL: No nausea, vomiting, diarrhea or abdominal pain.  GENITOURINARY: No dysuria, hematuria.  ENDOCRINE: No polyuria, nocturia,  HEMATOLOGY: No anemia, easy bruising or bleeding SKIN: No rash or lesion. MUSCULOSKELETAL: No joint pain or arthritis.   NEUROLOGIC: No tingling, numbness, weakness.  PSYCHIATRY: No anxiety or depression.   ROS  DRUG ALLERGIES:  No Known Allergies  VITALS:  Blood pressure 124/78, pulse (!) 103, temperature (!) 100.7 F (38.2 C), temperature source Oral, resp. rate 18, height 5\' 11"  (1.803 m), weight 135.2 kg (298 lb), SpO2 94 %.  PHYSICAL EXAMINATION:  GENERAL:  51 y.o.-year-old patient lying in the bed with no acute distress.  EYES: Pupils equal, round, reactive to light and accommodation. No scleral icterus. Extraocular muscles intact.  HEENT: Head  atraumatic, normocephalic. Oropharynx and nasopharynx clear. Tenderness around right ear. NECK:  Supple, no jugular venous distention. No thyroid enlargement, no tenderness.  LUNGS: Normal breath sounds bilaterally, no wheezing, rales,rhonchi or crepitation. No use of accessory muscles of respiration.  CARDIOVASCULAR: S1, S2 normal. No murmurs, rubs, or gallops.  ABDOMEN: Soft, nontender, nondistended. Bowel sounds present. No organomegaly or mass.  EXTREMITIES: No pedal edema, cyanosis, or clubbing.  NEUROLOGIC: Cranial nerves II through XII are intact. Muscle strength 5/5 in all extremities. Sensation intact. Gait not checked.  PSYCHIATRIC: The patient is alert and oriented x 3.  SKIN: No obvious rash, lesion, or ulcer.   Physical Exam LABORATORY PANEL:   CBC  Recent Labs Lab 03/24/16 0416  WBC 6.9  HGB 13.6  HCT 40.2  PLT 224   ------------------------------------------------------------------------------------------------------------------  Chemistries   Recent Labs Lab 03/22/16 1835  03/24/16 0416  NA 133*  < > 139  K 3.5  < > 3.7  CL 98*  < > 104  CO2 24  < > 26  GLUCOSE 109*  < > 103*  BUN 15  < > 15  CREATININE 1.23  < > 1.08  CALCIUM 9.5  < > 9.2  AST 25  --   --   ALT 25  --   --   ALKPHOS 50  --   --   BILITOT 0.9  --   --   < > = values in this interval not displayed. ------------------------------------------------------------------------------------------------------------------  Cardiac Enzymes  Recent Labs Lab 03/23/16 1032 03/23/16 1632  TROPONINI <0.03 <0.03   ------------------------------------------------------------------------------------------------------------------  RADIOLOGY:  Dg Chest 2 View  Result Date: 03/22/2016 CLINICAL DATA:  Productive cough for 1 week.  Fever and diarrhea. EXAM: CHEST  2 VIEW COMPARISON:  None. FINDINGS: The heart size and mediastinal contours are within normal limits. Both lungs are clear. Mild  degenerative spurring noted within the thoracic spine. No acute or suspicious osseous finding. IMPRESSION: No active cardiopulmonary disease. No evidence of pneumonia or pulmonary edema. Electronically Signed   By: Bary RichardStan  Maynard M.D.   On: 03/22/2016 19:11   Ct Head Wo Contrast  Result Date: 03/23/2016 CLINICAL DATA:  Dizziness and fever.  Headache. EXAM: CT HEAD WITHOUT CONTRAST TECHNIQUE: Contiguous axial images were obtained from the base of the skull through the vertex without intravenous contrast. COMPARISON:  Brain MRI May 08, 2015 FINDINGS: Brain: The ventricles are normal in size and configuration. There is no intracranial mass, hemorrhage, extra-axial fluid collection, or midline shift. No focal gray-white compartment lesions are identified. No acute infarct evident. Vascular: The venous angioma noted in the right parietal lobe on recent MR is apparent on CT but much less apparent than on MR. there is no hyperdense vessel. No vascular calcification is evident. Skull: The bony calvarium appears intact. Sinuses/Orbits: There is mucosal thickening in the periphery of the visualized right maxillary antrum. There is mild mucosal thickening in several ethmoid air cells bilaterally. There is slight mucosal thickening in the posterior right sphenoid sinus. Visualized paranasal sinuses elsewhere clear. Visualized orbits appear symmetric bilaterally. Other: Visualized mastoid air cells are clear. 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 no intracranial mass, hemorrhage, or extra-axial fluid collection. No evidence of acute infarct. Areas of relatively mild paranasal sinus disease noted. Electronically Signed   By: Bretta BangWilliam  Woodruff III M.D.   On: 03/23/2016 20:04    ASSESSMENT AND PLAN:   Active Problems:   SIRS (systemic inflammatory response syndrome) ([facility])   * SIRS   No clear source.   Suspected to have acute bronchitis on admission, but pt said- his symptoms of cough  and sputum are gettign better. He received 5 days oral augmentin before admission.   Have pain around right ear- got CT head ( shows some mucosal thickening in ethmoidal air cells ) and ENT consult.   Will give levaquin for now.   Influenza negative.   Blood cultures are negative so far.   Had MRI brain done in past, and no clear reasons found by neurologist.   Will also get ID involved, as no clear reason for his fever.  * Diabetes   Metformin. ISS.  * Dizziness   Likely due to ear infection this time.   If ENT discard that possibility, will call neurology as inpatient.  All the records are reviewed and case discussed with Care Management/Social Workerr. Management plans discussed with the patient, family and they are in agreement.  CODE STATUS: Full.  TOTAL TIME TAKING CARE OF THIS PATIENT: 35 minutes.    POSSIBLE D/C IN 2 DAYS, DEPENDING ON CLINICAL CONDITION.   Altamese DillingVACHHANI, Sharonlee Nine M.D on 03/24/2016   Between 7am to 6pm - Pager - [phone]  After 6pm go to [email or web address] - password [email or web address]  [facility]  Office  [phone]  CC: Primary care physician; Danella PentonMark F Miller, MD  Note: This dictation was prepared with Dragon dictation along with smaller phrase technology. Any transcriptional errors that result from this process are unintentional.

## 2016-03-25 LAB — GLUCOSE, CAPILLARY: Glucose-Capillary: 91 mg/dL (ref 65–99)

## 2016-03-25 MED ORDER — BUTALBITAL-APAP-CAFFEINE 50-325-40 MG PO TABS: 1.0000 | ORAL_TABLET | Freq: Four times a day (QID) | ORAL | Status: DC | PRN

## 2016-03-25 MED ORDER — BUTALBITAL-APAP-CAFFEINE 50-325-40 MG PO TABS: 1.0000 | ORAL_TABLET | Freq: Four times a day (QID) | ORAL | 0 refills | Status: DC | PRN

## 2016-03-25 NOTE — Progress Notes (Signed)
[facility]         [place]   03/25/2016  Patient: Jerry GurneyChristopher Sandoval   Date of Birth:  02/05/1966  Date of admission:  03/22/2016  Date of Discharge  03/25/2016    To Whom it May Concern:   Jerry GurneyChristopher Sandoval  may return to work on 03/29/16.  PHYSICAL ACTIVITY:  Full  If you have any questions or concerns, please don't hesitate to call.  Sincerely,   Altamese DillingVACHHANI, Turhan Chill M.D Pager [phone]- [phone] Office : [phone]   .   2

## 2016-03-25 NOTE — Progress Notes (Signed)
Patient wife is at bedside to take him home. Patient is A&O x 4 with no complaint or Signs and symptoms of distress.

## 2016-03-25 NOTE — Discharge Summary (Signed)
[facility] [place] [facility]   PATIENT NAME: Jerry Sandoval    MR#:  [id number]  DATE OF BIRTH:  01-05-1966  DATE OF ADMISSION:  03/22/2016 ADMITTING PHYSICIAN: Ihor Austin, MD  DATE OF DISCHARGE: 03/25/2016  PRIMARY CARE PHYSICIAN: Danella Penton, MD    ADMISSION DIAGNOSIS:  Tachycardia [R00.0] Fever, unspecified fever cause [R50.9]  DISCHARGE DIAGNOSIS:  Active Problems:   SIRS (systemic inflammatory response syndrome) (HCC)   Viral syndrome  SECONDARY DIAGNOSIS:   Past Medical History:  Diagnosis Date  . Arteriovenous malformation of cerebral vessels   . Arthritis   . Asthma   . Carpal tunnel syndrome   . Diabetes mellitus without complication (HCC)   . Hyperlipidemia   . Hypertension   . Sleep apnea     HOSPITAL COURSE:   * SIRS   No clear source.   Suspected to have acute bronchitis on admission, but pt said- his symptoms of cough and sputum are gettign better. He received 5 days oral augmentin before admission.   Have pain around right ear- got CT head ( shows some mucosal thickening in ethmoidal air cells ) and ENT consult.   Will give levaquin for now.   Influenza negative.   Blood cultures are negative so far.   Had MRI brain done in past, and no clear reasons found by neurologist.   Will also get ID involved, as no clear reason for his fever.  ID saw the pt, checked HIV- negative, also procalcitonin is negative.   Pt does not have fever for > 24 hrs, so as per ID, likely he had some " other viral infection, than influenza"  he can be dicharged home today.  * Diabetes   Metformin. ISS.  * Dizziness   Likely due to ear infection this time.  may be viral infection of upepr respi track and ENT.   Feels better now.  DISCHARGE CONDITIONS:   Stable.  CONSULTS OBTAINED:  Treatment Team:  Vernie Murders, MD Mick Sell, MD  DRUG ALLERGIES:  No Known Allergies  DISCHARGE MEDICATIONS:   Current Discharge  Medication List    START taking these medications   Details  butalbital-acetaminophen-caffeine (FIORICET, ESGIC) 50-325-40 MG tablet Take 1 tablet by mouth every 6 (six) hours as needed for headache. Qty: 14 tablet, Refills: 0      CONTINUE these medications which have NOT CHANGED   Details  albuterol (PROVENTIL HFA;VENTOLIN HFA) 108 (90 Base) MCG/ACT inhaler Inhale 2 puffs into the lungs every 6 (six) hours as needed for wheezing or shortness of breath.    allopurinol (ZYLOPRIM) 300 MG tablet Take 300 mg by mouth daily.    cetirizine (ZYRTEC) 10 MG tablet Take 10 mg by mouth daily.    diazepam (VALIUM) 5 MG tablet Take 5 mg by mouth daily.    lisinopril-hydrochlorothiazide (PRINZIDE,ZESTORETIC) 20-12.5 MG tablet Take 0.5 tablets by mouth daily.     magnesium oxide (MAG-OX) 400 MG tablet Take 400 mg by mouth daily.    metFORMIN (GLUCOPHAGE) 500 MG tablet Take 1,000 mg by mouth 2 (two) times daily with a meal.    topiramate (TOPAMAX) 50 MG tablet Take 50 mg by mouth 2 (two) times daily.    Vitamin D, Ergocalciferol, (DRISDOL) 50000 units CAPS capsule Take 50,000 Units by mouth every 7 (seven) days.    cyclobenzaprine (FLEXERIL) 10 MG tablet Take 10 mg by mouth 2 (two) times daily as needed for muscle spasms.    indomethacin (INDOCIN)  50 MG capsule Take 50 mg by mouth 2 (two) times daily with a meal.      STOP taking these medications     amoxicillin-clavulanate (AUGMENTIN) 875-125 MG tablet          DISCHARGE INSTRUCTIONS:    Follow with PMD in 1-2 weeks.  If you experience worsening of your admission symptoms, develop shortness of breath, life threatening emergency, suicidal or homicidal thoughts you must seek medical attention immediately by calling 911 or calling your MD immediately  if symptoms less severe.  You Must read complete instructions/literature along with all the possible adverse reactions/side effects for all the Medicines you take and that have been  prescribed to you. Take any new Medicines after you have completely understood and accept all the possible adverse reactions/side effects.   Please note  You were cared for by a hospitalist during your hospital stay. If you have any questions about your discharge medications or the care you received while you were in the hospital after you are discharged, you can call the unit and asked to speak with the hospitalist on call if the hospitalist that took care of you is not available. Once you are discharged, your primary care physician will handle any further medical issues. Please note that NO REFILLS for any discharge medications will be authorized once you are discharged, as it is imperative that you return to your primary care physician (or establish a relationship with a primary care physician if you do not have one) for your aftercare needs so that they can reassess your need for medications and monitor your lab values.    Today   CHIEF COMPLAINT:   Chief Complaint  Patient presents with  . Fever  . Dizziness  . Cough    HISTORY OF PRESENT ILLNESS:  Jerry Sandoval  is a 51 y.o. male with a known history of Arthritis, diabetes mellitus type 2, hyperlipidemia, hypertension, sleep apnea presented to the emergency room with fever and cough and congestion. Patient has the symptoms for the last few days. He had fever of 103F and a lot of sweats. He was seen by primary care physician started on oral Augmentin. He had generalized weakness and fatigue. Patient was evaluated in the emergency room was found to be tachycardic and heart rate of 140 bpm. his bedside lactate level was normal and chest x-ray did not reveal any pneumonia. No sick contacts at home. Patient was started on IV fluids in the emergency room and hospitalist service was consulted for further care of the patient. No complaints of any chest pain, shortness of breath and orthopnea.   VITAL SIGNS:  Blood pressure 103/67, pulse  84, temperature 98.4 F (36.9 C), temperature source Oral, resp. rate 18, height 5\' 11"  (1.803 m), weight 135.2 kg (298 lb), SpO2 96 %.  I/O:   Intake/Output Summary (Last 24 hours) at 03/25/16 0926 Last data filed at 03/25/16 0603  Gross per 24 hour  Intake          3496.25 ml  Output                0 ml  Net          3496.25 ml    PHYSICAL EXAMINATION:   GENERAL:  51 y.o.-year-old patient lying in the bed with no acute distress.  EYES: Pupils equal, round, reactive to light and accommodation. No scleral icterus. Extraocular muscles intact.  HEENT: Head atraumatic, normocephalic. Oropharynx and nasopharynx clear. Tenderness around right ear.  NECK:  Supple, no jugular venous distention. No thyroid enlargement, no tenderness.  LUNGS: Normal breath sounds bilaterally, no wheezing, rales,rhonchi or crepitation. No use of accessory muscles of respiration.  CARDIOVASCULAR: S1, S2 normal. No murmurs, rubs, or gallops.  ABDOMEN: Soft, nontender, nondistended. Bowel sounds present. No organomegaly or mass.  EXTREMITIES: No pedal edema, cyanosis, or clubbing.  NEUROLOGIC: Cranial nerves II through XII are intact. Muscle strength 5/5 in all extremities. Sensation intact. Gait not checked.  PSYCHIATRIC: The patient is alert and oriented x 3.  SKIN: No obvious rash, lesion, or ulcer.    DATA REVIEW:   CBC  Recent Labs Lab 03/24/16 0416  WBC 6.9  HGB 13.6  HCT 40.2  PLT 224    Chemistries   Recent Labs Lab 03/22/16 1835  03/24/16 0416  NA 133*  < > 139  K 3.5  < > 3.7  CL 98*  < > 104  CO2 24  < > 26  GLUCOSE 109*  < > 103*  BUN 15  < > 15  CREATININE 1.23  < > 1.08  CALCIUM 9.5  < > 9.2  AST 25  --   --   ALT 25  --   --   ALKPHOS 50  --   --   BILITOT 0.9  --   --   < > = values in this interval not displayed.  Cardiac Enzymes  Recent Labs Lab 03/23/16 1632  TROPONINI <0.03    Microbiology Results  Results for orders placed or performed during the hospital  encounter of 03/22/16  Culture, blood (Routine x 2)     Status: None (Preliminary result)   Collection Time: 03/22/16  6:34 PM  Result Value Ref Range Status   Specimen Description BLOOD RIGHT FOREARM  Final   Special Requests BOTTLES DRAWN AEROBIC AND ANAEROBIC [id number]  Final   Culture NO GROWTH 3 DAYS  Final   Report Status PENDING  Incomplete  Culture, blood (Routine x 2)     Status: None (Preliminary result)   Collection Time: 03/22/16  6:35 PM  Result Value Ref Range Status   Specimen Description BLOOD  R F ARM  Final   Special Requests BOTTLES DRAWN AEROBIC AND ANAEROBIC   Final   Culture NO GROWTH 3 DAYS  Final   Report Status PENDING  Incomplete  Urine culture     Status: None   Collection Time: 03/22/16  9:19 PM  Result Value Ref Range Status   Specimen Description URINE, RANDOM  Final   Special Requests NONE  Final   Culture   Final    NO GROWTH Performed at [facility], [address], [place], [place] [address]    Report Status 03/24/2016 FINAL  Final    RADIOLOGY:  Ct Head Wo Contrast  Result Date: 03/23/2016 CLINICAL DATA:  Dizziness and fever.  Headache. EXAM: CT HEAD WITHOUT CONTRAST TECHNIQUE: Contiguous axial images were obtained from the base of the skull through the vertex without intravenous contrast. COMPARISON:  Brain MRI May 08, 2015 FINDINGS: Brain: The ventricles are normal in size and configuration. There is no intracranial mass, hemorrhage, extra-axial fluid collection, or midline shift. No focal gray-white compartment lesions are identified. No acute infarct evident. Vascular: The venous angioma noted in the right parietal lobe on recent MR is apparent on CT but much less apparent than on MR. there is no hyperdense vessel. No vascular calcification is evident. Skull: The bony calvarium appears intact. Sinuses/Orbits:  There is mucosal thickening in the periphery of the visualized right maxillary antrum. There is mild mucosal thickening in  several ethmoid air cells bilaterally. There is slight mucosal thickening in the posterior right sphenoid sinus. Visualized paranasal sinuses elsewhere clear. Visualized orbits appear symmetric bilaterally. Other: Visualized mastoid air cells are clear. 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 no intracranial mass, hemorrhage, or extra-axial fluid collection. No evidence of acute infarct. Areas of relatively mild paranasal sinus disease noted. Electronically Signed   By: Bretta BangWilliam  Woodruff III M.D.   On: 03/23/2016 20:04    EKG:   Orders placed or performed during the hospital encounter of 03/22/16  . ED EKG  . ED EKG      Management plans discussed with the patient, family and they are in agreement.  CODE STATUS:     Code Status Orders        Start     Ordered   03/23/16 0447  Full code  Continuous     03/23/16 0446    Code Status History    Date Active Date Inactive Code Status Order ID Comments User Context   This patient has a current code status but no historical code status.      TOTAL TIME TAKING CARE OF THIS PATIENT: 35 minutes.    Altamese DillingVACHHANI, Bandon Sherwin M.D on 03/25/2016 at 9:26 AM  Between 7am to 6pm - Pager - [phone]  After 6pm go to [email or web address] - password [email or web address]  [facility] [place] [facility]  Office  [phone]  CC: Primary care physician; Danella PentonMark F Miller, MD   Note: This dictation was prepared with Dragon dictation along with smaller phrase technology. Any transcriptional errors that result from this process are unintentional.

## 2016-03-27 LAB — CULTURE, BLOOD (ROUTINE X 2)
Culture: NO GROWTH
Culture: NO GROWTH

## 2016-05-05 ENCOUNTER — Ambulatory Visit: Payer: BLUE CROSS/BLUE SHIELD | Admitting: Urology

## 2016-05-05 ENCOUNTER — Encounter: Payer: Self-pay | Admitting: Urology

## 2016-05-05 VITALS — BP 132/86 | HR 108 | Ht 71.0 in | Wt 301.9 lb

## 2016-05-05 DIAGNOSIS — R310 Gross hematuria: Secondary | ICD-10-CM

## 2016-05-05 DIAGNOSIS — R3129 Other microscopic hematuria: Secondary | ICD-10-CM | POA: Diagnosis not present

## 2016-05-05 DIAGNOSIS — Z87442 Personal history of urinary calculi: Secondary | ICD-10-CM

## 2016-05-05 NOTE — Progress Notes (Signed)
05/05/2016 8:56 PM   Jerry Footmanhristopher W Sandoval 07/24/1965 [id number]  Referring provider: Danella PentonMark F Miller, MD [id number] [facility] [address] [facility] [place], [place] [id number]  Chief Complaint  Patient presents with  . Hematuria    HPI: 51 year old male who presents today for further evaluation of microscopic hematuria and gross hematuria. UA today shows 3-10 red blood cells per high power field.    He has noted this since December 2017, he has had intermittent gross hematuria which comes and goes.  He denies any urinary symptoms including gross hematuria, urinary frequency or urgency. No UTIs.  He does have a personal history of kidney stones.  He was able to pass his stones spontaneously.   No recent flank pain.   He did have an abdominal x-ray last year which showed "bilateral calcific densities."  He is not on any blood thinners.   No chemical exposures.    Former smoker, less than 1 pack per week for 10 years.   No family history of GU history.     PMH: Past Medical History:  Diagnosis Date  . Arteriovenous malformation of cerebral vessels   . Arthritis   . Asthma   . Carpal tunnel syndrome   . Diabetes mellitus without complication (HCC)   . Hyperlipidemia   . Hypertension   . Sleep apnea     Surgical History: Past Surgical History:  Procedure Laterality Date  . CEREBRAL ANGIOGRAM    . COLONOSCOPY WITH PROPOFOL N/A 07/09/2015   Procedure: COLONOSCOPY WITH PROPOFOL;  Surgeon: Christena DeemMartin U Skulskie, MD;  [facility];  Service: Endoscopy;  Laterality: N/A;  . WISDOM TOOTH EXTRACTION      Home Medications:  Allergies as of 05/05/2016   No Known Allergies     Medication List       Accurate as of 05/05/16  8:56 PM. Always use your most recent med list.          albuterol 108 (90 Base) MCG/ACT inhaler Commonly known as:  PROVENTIL HFA;VENTOLIN HFA Inhale 2 puffs into the lungs every 6 (six) hours as needed for wheezing or shortness of  breath.   allopurinol 300 MG tablet Commonly known as:  ZYLOPRIM Take 300 mg by mouth daily.   butalbital-acetaminophen-caffeine 50-325-40 MG tablet Commonly known as:  FIORICET, ESGIC Take 1 tablet by mouth every 6 (six) hours as needed for headache.   cetirizine 10 MG tablet Commonly known as:  ZYRTEC Take 10 mg by mouth daily.   cyclobenzaprine 10 MG tablet Commonly known as:  FLEXERIL Take 10 mg by mouth 2 (two) times daily as needed for muscle spasms.   diazepam 5 MG tablet Commonly known as:  VALIUM Take 5 mg by mouth daily.   indomethacin 50 MG capsule Commonly known as:  INDOCIN Take 50 mg by mouth 2 (two) times daily with a meal.   lisinopril-hydrochlorothiazide 20-12.5 MG tablet Commonly known as:  PRINZIDE,ZESTORETIC Take 0.5 tablets by mouth daily.   magnesium oxide 400 MG tablet Commonly known as:  MAG-OX Take 400 mg by mouth daily.   metFORMIN 500 MG tablet Commonly known as:  GLUCOPHAGE Take 1,000 mg by mouth 2 (two) times daily with a meal.   topiramate 50 MG tablet Commonly known as:  TOPAMAX Take 50 mg by mouth 2 (two) times daily.   Vitamin D (Ergocalciferol) 50000 units Caps capsule Commonly known as:  DRISDOL Take 50,000 Units by mouth every 7 (seven) days.       Allergies:  No Known Allergies  Family History: Family History  Problem Relation Age of Onset  . Hypertension Neg Hx   . Diabetes Neg Hx   . CAD Neg Hx   . Prostate cancer Neg Hx   . Bladder Cancer Neg Hx   . Kidney cancer Neg Hx     Social History:  reports that he has quit smoking. He uses smokeless tobacco. He reports that he does not drink alcohol or use drugs.  ROS: UROLOGY Frequent Urination?: No Hard to postpone urination?: No Burning/pain with urination?: No Get up at night to urinate?: No Leakage of urine?: No Urine stream starts and stops?: No Trouble starting stream?: No Do you have to strain to urinate?: No Blood in urine?: Yes Urinary tract  infection?: No Sexually transmitted disease?: No Injury to kidneys or bladder?: No Painful intercourse?: No Weak stream?: Yes Erection problems?: No Penile pain?: No  Gastrointestinal Nausea?: No Vomiting?: No Indigestion/heartburn?: No Diarrhea?: No Constipation?: No  Constitutional Fever: No Night sweats?: No Weight loss?: No Fatigue?: Yes  Skin Skin rash/lesions?: No Itching?: No  Eyes Blurred vision?: No Double vision?: No  Ears/Nose/Throat Sore throat?: No Sinus problems?: No  Hematologic/Lymphatic Swollen glands?: No Easy bruising?: No  Cardiovascular Leg swelling?: No Chest pain?: No  Respiratory Cough?: No Shortness of breath?: No  Endocrine Excessive thirst?: No  Musculoskeletal Back pain?: Yes Joint pain?: No  Neurological Headaches?: No Dizziness?: No  Psychologic Depression?: No Anxiety?: No  Physical Exam: BP 132/86   Pulse (!) 108   Ht 5\' 11"  (1.803 m)   Wt (!) 301 lb 14.4 oz (136.9 kg)   BMI 42.11 kg/m   Constitutional:  Alert and oriented, No acute distress. HEENT: [place] AT, moist mucus membranes.  Trachea midline, no masses. Cardiovascular: No clubbing, cyanosis, or edema. Respiratory: Normal respiratory effort, no increased work of breathing. GI: Abdomen is soft, nontender, nondistended, no abdominal masses GU: No CVA tenderness.  Rectal: Normal sphincter tone. 30 cc prostate, nontender, no nodules. Skin: No rashes, bruises or suspicious lesions. Neurologic: Grossly intact, no focal deficits, moving all 4 extremities. Psychiatric: Normal mood and affect.  Laboratory Data: Lab Results  Component Value Date   WBC 6.9 03/24/2016   HGB 13.6 03/24/2016   HCT 40.2 03/24/2016   MCV 92.0 03/24/2016   PLT 224 03/24/2016    Lab Results  Component Value Date   CREATININE 1.08 03/24/2016   PSA 04/15/16 0.65  Urinalysis UA today reviewed, 3-10 red blood cells per high-power field otherwise negative.  Pertinent  Imaging: n/a  Assessment & Plan:    1. Microscopic hematuria We discussed the differential diagnosis for microscopic hematuria including nephrolithiasis, renal or upper tract tumors, bladder stones, UTIs, or bladder tumors as well as undetermined etiologies.  Per AUA guidelines, I did recommend complete microscopic hematuria evaluation including CTU, possible urine cytology, and office cystoscopy.   - Urinalysis, Complete - CT HEMATURIA WORKUP; Future  2. Gross hematuria As above  3. History of kidney stones Personal history of kidney stones, currently asymptomatic Abdominal x-ray last year showed bilateral nephrolithiasis of unknown size and number   Return in about 4 weeks (around 06/02/2016) for cystoscopy, f/u CT urogram.  Vanna Scotland, MD  [facility] [address] [place], [place] [address] [phone]

## 2016-05-06 LAB — URINALYSIS, COMPLETE
Bilirubin, UA: NEGATIVE
Glucose, UA: NEGATIVE
Ketones, UA: NEGATIVE
Leukocytes, UA: NEGATIVE
Nitrite, UA: NEGATIVE
Protein, UA: NEGATIVE
Specific Gravity, UA: 1.015 (ref 1.005–1.030)
Urobilinogen, Ur: 0.2 mg/dL (ref 0.2–1.0)
pH, UA: 8.5 — ABNORMAL HIGH (ref 5.0–7.5)

## 2016-05-06 LAB — MICROSCOPIC EXAMINATION
Bacteria, UA: NONE SEEN
Epithelial Cells (non renal): NONE SEEN /hpf (ref 0–10)

## 2016-05-07 ENCOUNTER — Other Ambulatory Visit: Payer: Self-pay | Admitting: Urology

## 2016-05-07 DIAGNOSIS — R319 Hematuria, unspecified: Secondary | ICD-10-CM

## 2016-05-11 ENCOUNTER — Ambulatory Visit
Admission: RE | Admit: 2016-05-11 | Discharge: 2016-05-11 | Disposition: A | Discharge status: Home / Self Care | Payer: BLUE CROSS/BLUE SHIELD | Source: Ambulatory Visit | Attending: Urology | Admitting: Urology

## 2016-05-11 DIAGNOSIS — R319 Hematuria, unspecified: Secondary | ICD-10-CM

## 2016-05-11 MED ORDER — IOPAMIDOL (ISOVUE-300) INJECTION 61%
125.0000 mL | Freq: Once | INTRAVENOUS | Status: AC | PRN
  Administered 2016-05-11: 125 mL via INTRAVENOUS

## 2016-05-12 ENCOUNTER — Telehealth: Payer: Self-pay

## 2016-05-12 NOTE — Telephone Encounter (Signed)
-----   Message from Vanna ScotlandAshley Brandon, MD sent at 05/11/2016  4:11 PM EDT ----- Please let Mr. Jerry Sandoval know that I have reviewed his CT scan. He has some nonobstructing kidney stones which we will discuss at his follow-up visit. He also has severe fatty liver disease.  Vanna ScotlandAshley Brandon, MD

## 2016-05-12 NOTE — Telephone Encounter (Signed)
Spoke with pt in reference to CT results. Pt voiced understanding.  

## 2016-06-12 ENCOUNTER — Other Ambulatory Visit: Payer: Self-pay | Admitting: Radiology

## 2016-06-12 ENCOUNTER — Encounter: Payer: Self-pay | Admitting: Urology

## 2016-06-12 ENCOUNTER — Ambulatory Visit (INDEPENDENT_AMBULATORY_CARE_PROVIDER_SITE_OTHER): Payer: BLUE CROSS/BLUE SHIELD | Admitting: Urology

## 2016-06-12 VITALS — BP 105/67 | HR 90 | Ht 71.0 in | Wt 295.0 lb

## 2016-06-12 DIAGNOSIS — N200 Calculus of kidney: Secondary | ICD-10-CM

## 2016-06-12 DIAGNOSIS — R310 Gross hematuria: Secondary | ICD-10-CM

## 2016-06-12 LAB — URINALYSIS, COMPLETE
Bilirubin, UA: NEGATIVE
Glucose, UA: NEGATIVE
Ketones, UA: NEGATIVE
Leukocytes, UA: NEGATIVE
Nitrite, UA: NEGATIVE
Protein, UA: NEGATIVE
Specific Gravity, UA: 1.015 (ref 1.005–1.030)
Urobilinogen, Ur: 0.2 mg/dL (ref 0.2–1.0)
pH, UA: 6 (ref 5.0–7.5)

## 2016-06-12 LAB — MICROSCOPIC EXAMINATION
Bacteria, UA: NONE SEEN
Epithelial Cells (non renal): NONE SEEN /hpf (ref 0–10)

## 2016-06-12 MED ORDER — CIPROFLOXACIN HCL 500 MG PO TABS
500.0000 mg | ORAL_TABLET | Freq: Once | ORAL | Status: AC
  Administered 2016-06-12: 500 mg via ORAL

## 2016-06-12 MED ORDER — LIDOCAINE HCL 2 % EX GEL
1.0000 "application " | Freq: Once | CUTANEOUS | Status: AC
  Administered 2016-06-12: 1 via URETHRAL

## 2016-06-12 NOTE — Progress Notes (Signed)
   06/12/16  CC:  Chief Complaint  Patient presents with  . Cysto    HPI:  51 year old male with gross hematuria who presents today for office cystoscopy. She underwent CT urogram as part of the workup revealing nonobstructing bilateral nephrolithiasis. Otherwise, the CT scan was unremarkable for any GU pathology. Please see office note for additional details.   Blood pressure 105/67, pulse 90, height  (1.803 m), weight 295 lb (133.8 kg). NED. A&Ox3.   No respiratory distress   Abd soft, NT, ND Normal phallus with bilateral descended testicles  Cystoscopy Procedure Note  Patient identification was confirmed, informed consent was obtained, and patient was prepped using Betadine solution.  Lidocaine jelly was administered per urethral meatus.    Preoperative abx where received prior to procedure.     Pre-Procedure: - Inspection reveals a normal caliber ureteral meatus.  Procedure: The flexible cystoscope was introduced without difficulty - No urethral strictures/lesions are present. - Normal prostate unremarkable - Normal bladder neck - Bilateral ureteral orifices identified - Bladder mucosa  reveals no ulcers, tumors, or lesions - No bladder stones - No trabeculation  Retroflexion show unremarkable.   Post-Procedure: - Patient tolerated the procedure well  Assessment/ Plan:  1. Gross hematuria Cystoscopy negative CT urogram with nonobstructing stones, see clinic note for details - Urinalysis, Complete - ciprofloxacin (CIPRO) tablet 500 mg; Take 1 tablet (500 mg total) by mouth once. - lidocaine (XYLOCAINE) 2 % jelly 1 application; Place 1 application into the urethra once.  2. Kidney stones Likely source of gross hematuria   Vanna Scotland, MD

## 2016-06-13 NOTE — Progress Notes (Signed)
06/12/2016 2:17 PM   Jerry Sandoval [birth year] [id number]  Referring provider: Danella Penton, MD [phone] [facility] [address] [facility] [place], [place] [address]  Chief Complaint  Patient presents with  . Cysto    HPI: 51 year old male with episodes of gross hematuria, history of kidney stones who presents today to review CT urogram as well as undergo cystoscopy. Please see cystoscopy note for details, no evidence of pathology on cystoscopy today.   CT urogram doe show an approximately 8 mm stone in the right upper pole (~850 Hounsfield units, stone to skin distance 13 cm) as well as a 5 mm nonobstructing left lower pole calculus.  He denies any current flank pain, dysuria, or any further gross hematuria since his last visit. He denies any baseline voiding symptoms.  He does have a personal history kidney stones. He is able to pass proximally 4 mm stone in the past but with some difficulty. He's never required surgical intervention for his stones.   He is not on any blood thinners.   No chemical exposures.    Former smoker, less than 1 pack per week for 10 years.   No family history of GU history.    He does have a history of sleep apnea and wears CPAP at night.   PMH: Past Medical History:  Diagnosis Date  . Arteriovenous malformation of cerebral vessels   . Arthritis   . Asthma   . Carpal tunnel syndrome   . Diabetes mellitus without complication (HCC)   . Hyperlipidemia   . Hypertension   . Sleep apnea     Surgical History: Past Surgical History:  Procedure Laterality Date  . CEREBRAL ANGIOGRAM    . COLONOSCOPY WITH PROPOFOL N/A 07/09/2015   Procedure: COLONOSCOPY WITH PROPOFOL;  Surgeon: Christena Deem, MD;  Location: [facility] ENDOSCOPY;  Service: Endoscopy;  Laterality: N/A;  . WISDOM TOOTH EXTRACTION      Home Medications:  Allergies as of 06/12/2016   No Known Allergies     Medication List       Accurate as of 06/12/16 11:59  PM. Always use your most recent med list.          albuterol 108 (90 Base) MCG/ACT inhaler Commonly known as:  PROVENTIL HFA;VENTOLIN HFA Inhale 2 puffs into the lungs every 6 (six) hours as needed for wheezing or shortness of breath.   allopurinol 300 MG tablet Commonly known as:  ZYLOPRIM Take 300 mg by mouth daily.   butalbital-acetaminophen-caffeine 50-325-40 MG tablet Commonly known as:  FIORICET, ESGIC Take 1 tablet by mouth every 6 (six) hours as needed for headache.   cetirizine 10 MG tablet Commonly known as:  ZYRTEC Take 10 mg by mouth daily.   cyclobenzaprine 10 MG tablet Commonly known as:  FLEXERIL Take 10 mg by mouth 2 (two) times daily as needed for muscle spasms.   diazepam 5 MG tablet Commonly known as:  VALIUM Take 5 mg by mouth daily.   indomethacin 50 MG capsule Commonly known as:  INDOCIN Take 50 mg by mouth 2 (two) times daily with a meal.   lisinopril-hydrochlorothiazide 20-12.5 MG tablet Commonly known as:  PRINZIDE,ZESTORETIC Take 0.5 tablets by mouth daily.   magnesium oxide 400 MG tablet Commonly known as:  MAG-OX Take 400 mg by mouth daily.   metFORMIN 500 MG tablet Commonly known as:  GLUCOPHAGE Take 1,000 mg by mouth 2 (two) times daily with a meal.   topiramate 50 MG tablet Commonly known  as:  TOPAMAX Take 50 mg by mouth 2 (two) times daily.   Vitamin D (Ergocalciferol) 50000 units Caps capsule Commonly known as:  DRISDOL Take 50,000 Units by mouth every 7 (seven) days.       Allergies: No Known Allergies  Family History: Family History  Problem Relation Age of Onset  . Hypertension Neg Hx   . Diabetes Neg Hx   . CAD Neg Hx   . Prostate cancer Neg Hx   . Bladder Cancer Neg Hx   . Kidney cancer Neg Hx     Social History:  reports that he has quit smoking. He uses smokeless tobacco. He reports that he does not drink alcohol or use drugs.  ROS: 12 point review of systems is negative other than as per history of  present illness.  Physical Exam: BP 105/67   Pulse 90   Ht  (1.803 m)   Wt 295 lb (133.8 kg)   BMI 41.14 kg/m   Constitutional:  Alert and oriented, No acute distress. HEENT: [place] AT, moist mucus membranes.  Trachea midline, no masses. Cardiovascular: No clubbing, cyanosis, or edema. Respiratory: Normal respiratory effort, no increased work of breathing. GI: Abdomen is soft, nontender, nondistended, no abdominal masses GU: No CVA tenderness.  Bilateral descended testicles, grossly normal. Circumcised phallus with orthotopic meatus. Skin: No rashes, bruises or suspicious lesions.. Neurologic: Grossly intact, no focal deficits, moving all 4 extremities. Psychiatric: Normal mood and affect.  Laboratory Data: Lab Results  Component Value Date   WBC 6.9 03/24/2016   HGB 13.6 03/24/2016   HCT 40.2 03/24/2016   MCV 92.0 03/24/2016   PLT 224 03/24/2016    Lab Results  Component Value Date   CREATININE 1.08 03/24/2016    Urinalysis Results for orders placed or performed in visit on 06/12/16  Microscopic Examination  Result Value Ref Range   WBC, UA 0-5 0 - 5 /hpf   RBC, UA 0-2 0 - 2 /hpf   Epithelial Cells (non renal) None seen 0 - 10 /hpf   Crystals Present (A) N/A   Crystal Type Calcium Oxalate N/A   Bacteria, UA None seen None seen/Few  Urinalysis, Complete  Result Value Ref Range   Specific Gravity, UA 1.015 1.005 - 1.030   pH, UA 6.0 5.0 - 7.5   Color, UA Yellow Yellow   Appearance Ur Clear Clear   Leukocytes, UA Negative Negative   Protein, UA Negative Negative/Trace   Glucose, UA Negative Negative   Ketones, UA Negative Negative   RBC, UA Trace (A) Negative   Bilirubin, UA Negative Negative   Urobilinogen, Ur 0.2 0.2 - 1.0 mg/dL   Nitrite, UA Negative Negative   Microscopic Examination See below:     Pertinent Imaging: CLINICAL DATA:  Gross hematuria, intermittent pain x3 months  EXAM: CT ABDOMEN AND PELVIS WITHOUT AND WITH  CONTRAST  TECHNIQUE: Multidetector CT imaging of the abdomen and pelvis was performed following the standard protocol before and following the bolus administration of intravenous contrast.  CONTRAST:  ISOVUE-300 IOPAMIDOL (ISOVUE-300) INJECTION 61%  COMPARISON:  03/25/2009  FINDINGS: Lower chest: Lung bases are clear.  Hepatobiliary: Moderate to severe hepatic steatosis with focal fatty sparing.  Layering tiny gallstones (series 2/ image 30), without associated inflammatory changes. No intrahepatic or extrahepatic ductal dilatation.  Pancreas: Within normal limits.  Spleen: Within normal limits.  Adrenals/Urinary Tract: Adrenal glands are within normal limits.  Bilateral renal cysts, including a dominant 2.1 cm cyst along the posterior interpolar  right kidney (series 6/ image 38). No enhancing renal lesions.  7 mm nonobstructing right upper pole renal calculus (series 2/ image 34). 5 mm nonobstructing left lower pole renal calculus (series 2/image 39). No ureteral or bladder calculi. No hydronephrosis.  On delayed imaging, there are no filling defects in the bilateral opacified proximal collecting systems, ureters, or bladder.  Bladder is within normal limits.  Stomach/Bowel: Stomach is within normal limits.  No evidence of bowel obstruction.  Normal appendix (series 6/ image 46).  Vascular/Lymphatic: No evidence of abdominal aortic aneurysm.  Atherosclerotic calcifications of the abdominal aorta and branch vessels.  No suspicious abdominopelvic lymphadenopathy.  Reproductive: Prostate is notable for dystrophic calcifications.  Other: No abdominopelvic ascites.  Musculoskeletal: Grade 1 spondylolisthesis at L5-S1.  Degenerative changes of the visualized thoracolumbar spine.  IMPRESSION: 7 mm nonobstructing right upper pole renal calculus. 5 mm nonobstructing left lower pole renal calculus. No ureteral or bladder calculi. No  hydronephrosis.  Bilateral renal cysts, including a dominant 2.1 cm posterior interpolar right renal cyst, benign (Bosniak I). No enhancing renal lesions.  Cholelithiasis, without associated inflammatory changes.  Moderate to severe hepatic steatosis.   Electronically Signed   By: Charline Bills M.D.   On: 05/11/2016 09:12  CT scan was personally reviewed today with the patient.  Assessment & Plan:   1. Gross hematuria Cystoscopy negative CT urogram as above - Urinalysis, Complete - ciprofloxacin (CIPRO) tablet 500 mg; Take 1 tablet (500 mg total) by mouth once. - lidocaine (XYLOCAINE) 2 % jelly 1 application; Place 1 application into the urethra once.  2. Kidney stones Multiple bilateral nonobstructing stones, up to 8 mm in the right upper pole. Options including surveillance versus intervention were reviewed in detail. Left lower pole stone is relatively small and has a less likely chance of passing in the near future. Right upper pole stone is fairly large and may be problematic.  We discussed various treatment options including ESWL vs. ureteroscopy, laser lithotripsy, and stent. We discussed the risks and benefits of both including bleeding, infection, damage to surrounding structures, efficacy with need for possible further intervention, and need for temporary ureteral stent.  He is interested in treating his right upper pole stone prophylactically. He would like to be scheduled for right ESWL.  Schedule R ESWL  Vanna Scotland, MD  [facility] [address] [place], [place] [address] [phone]

## 2016-06-25 ENCOUNTER — Ambulatory Visit: Payer: BLUE CROSS/BLUE SHIELD

## 2016-06-25 ENCOUNTER — Encounter: Payer: Self-pay | Admitting: *Deleted

## 2016-06-25 ENCOUNTER — Encounter
Admission: RE | Disposition: A | Discharge status: Home / Self Care | Payer: Self-pay | Source: Ambulatory Visit | Attending: Urology

## 2016-06-25 ENCOUNTER — Ambulatory Visit
Admission: RE | Admit: 2016-06-25 | Discharge: 2016-06-25 | Disposition: A | Discharge status: Home / Self Care | Payer: BLUE CROSS/BLUE SHIELD | Source: Ambulatory Visit | Attending: Urology | Admitting: Urology

## 2016-06-25 DIAGNOSIS — G5600 Carpal tunnel syndrome, unspecified upper limb: Secondary | ICD-10-CM | POA: Insufficient documentation

## 2016-06-25 DIAGNOSIS — Z87442 Personal history of urinary calculi: Secondary | ICD-10-CM | POA: Insufficient documentation

## 2016-06-25 DIAGNOSIS — G4730 Sleep apnea, unspecified: Secondary | ICD-10-CM | POA: Insufficient documentation

## 2016-06-25 DIAGNOSIS — N200 Calculus of kidney: Secondary | ICD-10-CM | POA: Diagnosis present

## 2016-06-25 DIAGNOSIS — E119 Type 2 diabetes mellitus without complications: Secondary | ICD-10-CM | POA: Insufficient documentation

## 2016-06-25 DIAGNOSIS — J45909 Unspecified asthma, uncomplicated: Secondary | ICD-10-CM | POA: Diagnosis not present

## 2016-06-25 DIAGNOSIS — Z87891 Personal history of nicotine dependence: Secondary | ICD-10-CM | POA: Diagnosis not present

## 2016-06-25 DIAGNOSIS — E785 Hyperlipidemia, unspecified: Secondary | ICD-10-CM | POA: Insufficient documentation

## 2016-06-25 DIAGNOSIS — Z7984 Long term (current) use of oral hypoglycemic drugs: Secondary | ICD-10-CM | POA: Insufficient documentation

## 2016-06-25 DIAGNOSIS — I10 Essential (primary) hypertension: Secondary | ICD-10-CM | POA: Insufficient documentation

## 2016-06-25 DIAGNOSIS — Z79899 Other long term (current) drug therapy: Secondary | ICD-10-CM | POA: Insufficient documentation

## 2016-06-25 DIAGNOSIS — M1990 Unspecified osteoarthritis, unspecified site: Secondary | ICD-10-CM | POA: Diagnosis not present

## 2016-06-25 HISTORY — PX: EXTRACORPOREAL SHOCK WAVE LITHOTRIPSY: SHX1557

## 2016-06-25 LAB — GLUCOSE, CAPILLARY: Glucose-Capillary: 118 mg/dL — ABNORMAL HIGH (ref 65–99)

## 2016-06-25 IMAGING — CR DG ABDOMEN 1V
1 series · 2 of 2 positions shown · non-contrast
Comparison: CT scan of [DATE].

CLINICAL DATA: Right nephrolithiasis.

EXAM:
ABDOMEN - 1 VIEW

[Series 1: dg abd 1 view · 0.14mm/px · 2 of 2 slices shown]
[im 1/2]
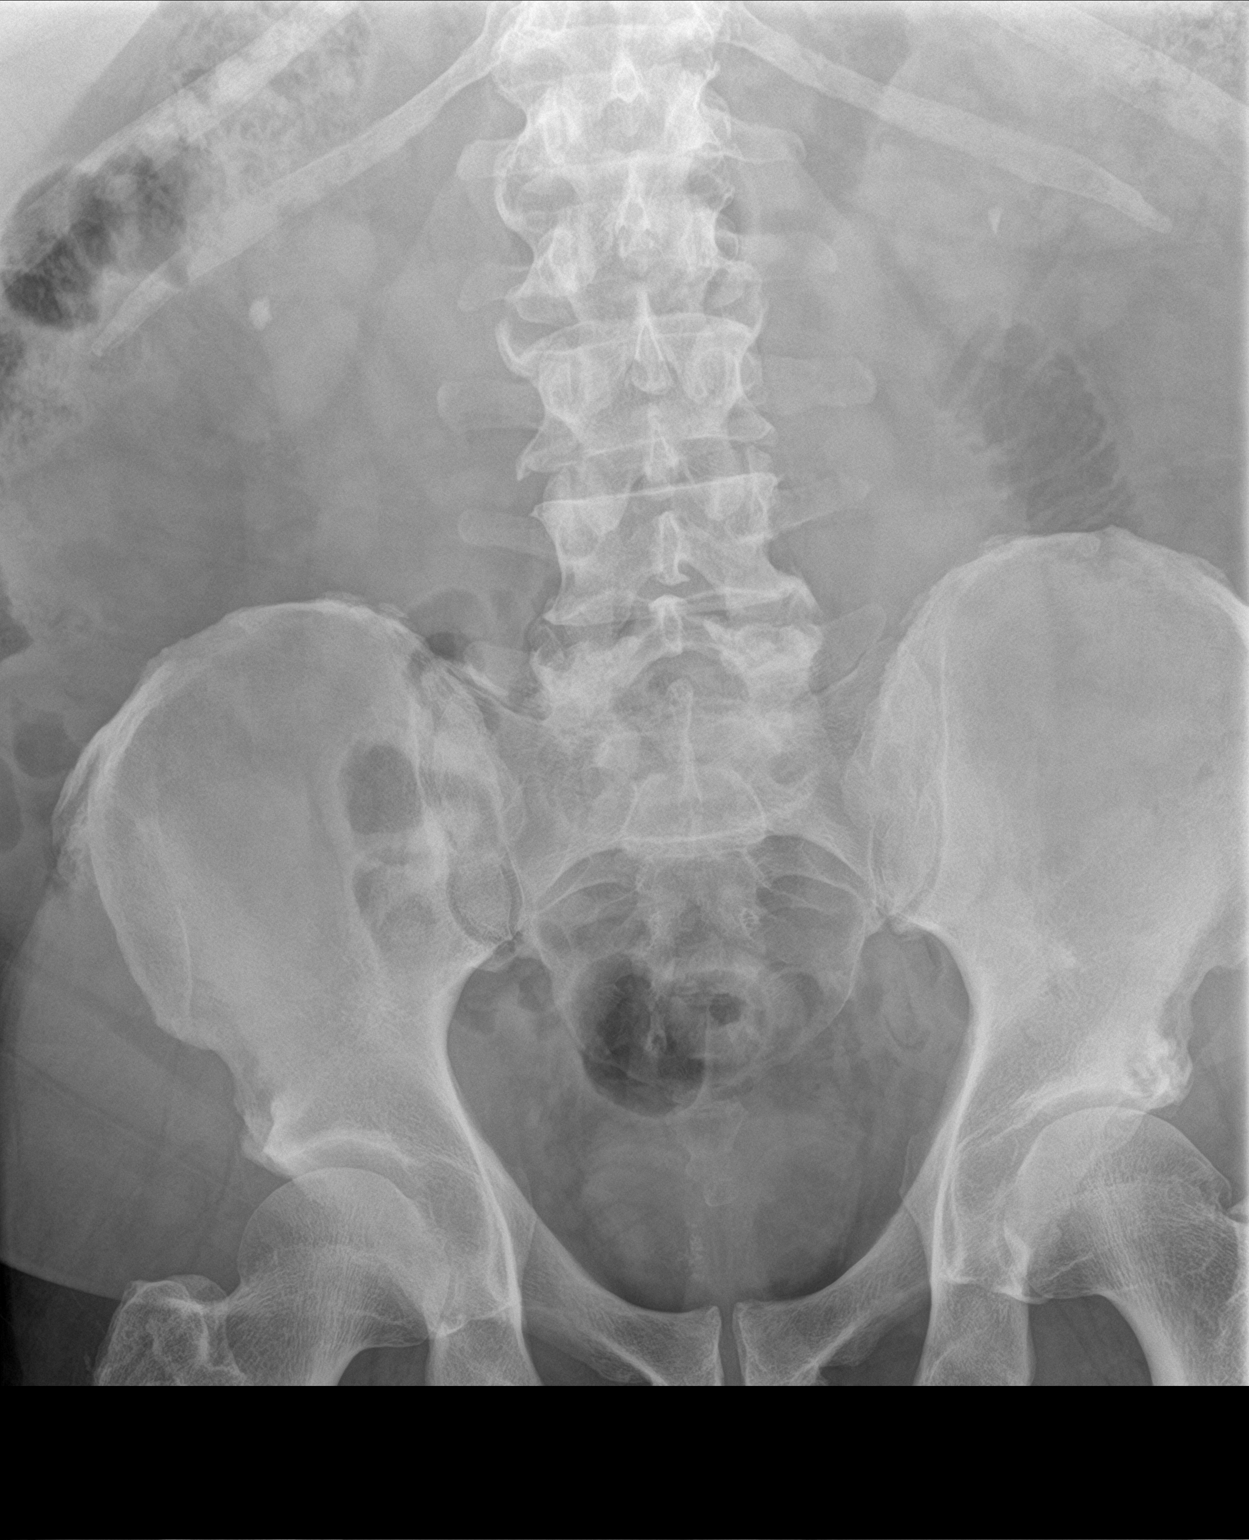
[im 2/2]
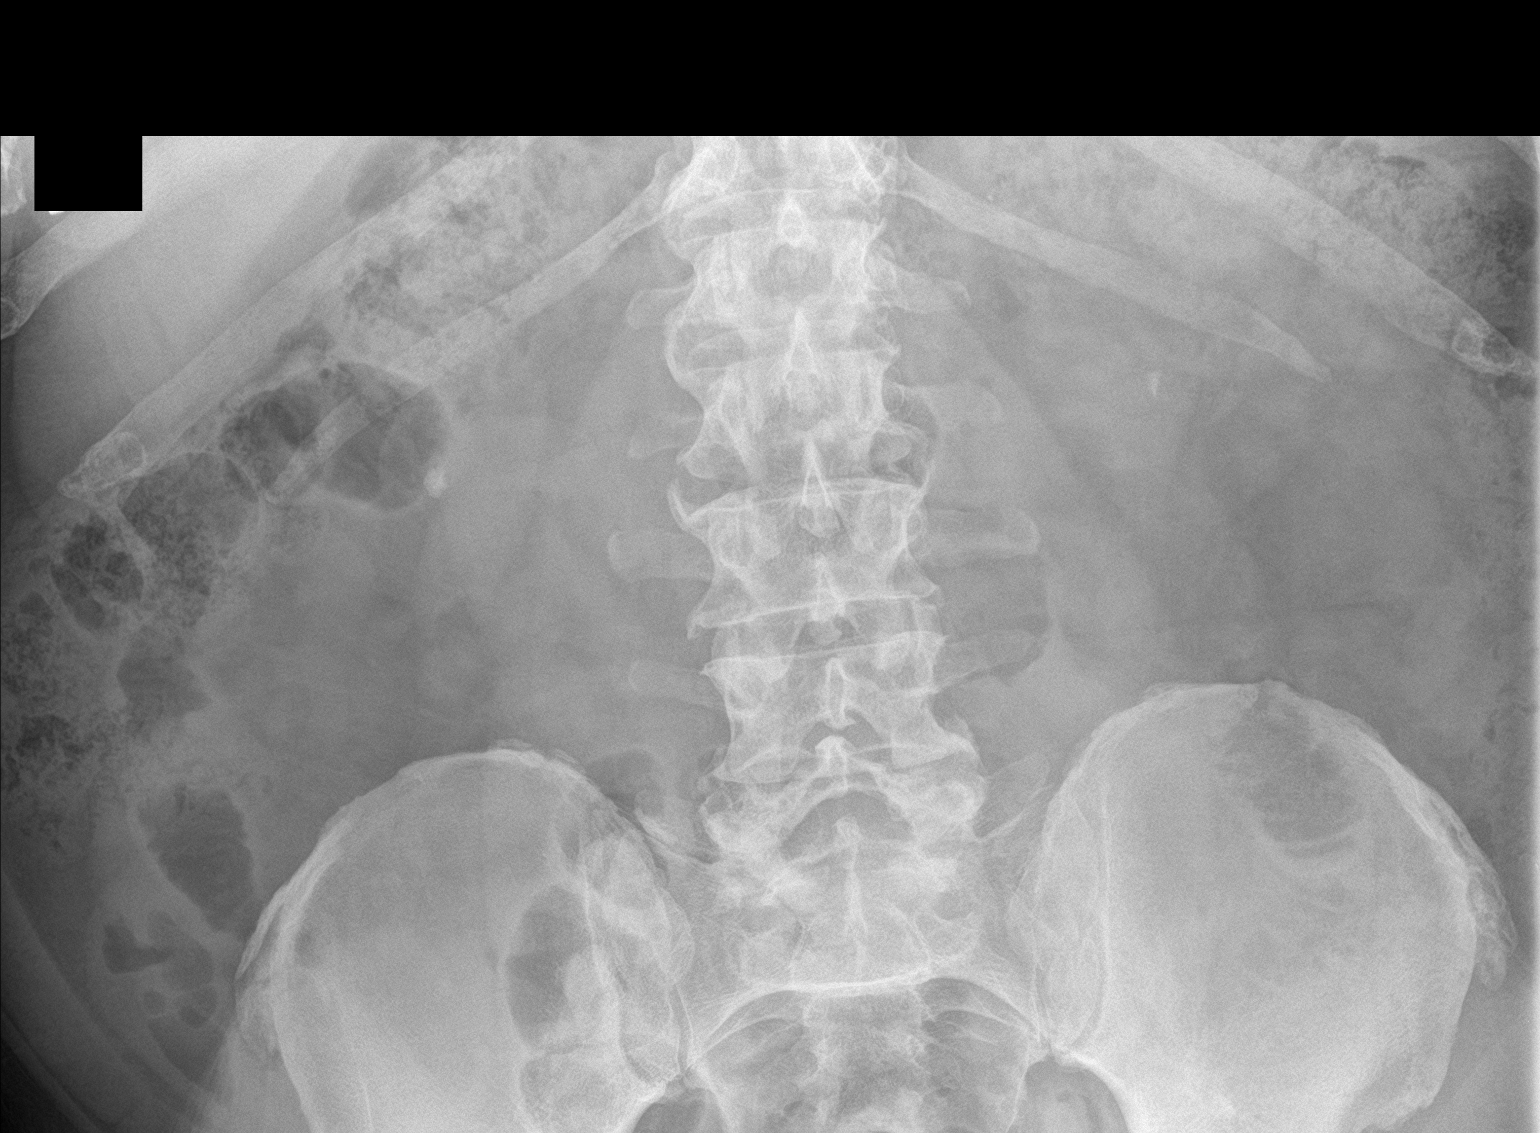

[2 of 2 positions shown; findings below may reference images not displayed]

FINDINGS: The bowel gas pattern is normal. Small nonobstructive calculus is
seen projected over lower pole left kidney. Larger calculus is seen
over upper pole of right kidney. These correspond to calculi seen on
prior CT scan.
IMPRESSION: Bilateral renal calculi are noted which correspond to abnormality
seen on CT scan. No evidence of bowel obstruction or ileus.

## 2016-06-25 SURGERY — LITHOTRIPSY, ESWL
Anesthesia: Moderate Sedation | Laterality: Right

## 2016-06-25 MED ORDER — DEXTROSE-NACL 5-0.45 % IV SOLN
INTRAVENOUS | Status: DC
  Administered 2016-06-25: 07:00:00 via INTRAVENOUS

## 2016-06-25 MED ORDER — CIPROFLOXACIN HCL 500 MG PO TABS
ORAL_TABLET | ORAL | Status: AC
  Administered 2016-06-25: 500 mg via ORAL
  Filled 2016-06-25: qty 1

## 2016-06-25 MED ORDER — HYDROCODONE-ACETAMINOPHEN 5-325 MG PO TABS: 1.0000 | ORAL_TABLET | Freq: Four times a day (QID) | ORAL | 0 refills | Status: DC | PRN

## 2016-06-25 MED ORDER — DIAZEPAM 5 MG PO TABS
ORAL_TABLET | ORAL | Status: AC
  Administered 2016-06-25: 10 mg via ORAL
  Filled 2016-06-25: qty 2

## 2016-06-25 MED ORDER — DIAZEPAM 5 MG PO TABS
10.0000 mg | ORAL_TABLET | ORAL | Status: AC
  Administered 2016-06-25: 10 mg via ORAL

## 2016-06-25 MED ORDER — TAMSULOSIN HCL 0.4 MG PO CAPS: 0.4000 mg | ORAL_CAPSULE | Freq: Every day | ORAL | 0 refills | Status: DC

## 2016-06-25 MED ORDER — CIPROFLOXACIN HCL 500 MG PO TABS
500.0000 mg | ORAL_TABLET | ORAL | Status: AC
  Administered 2016-06-25: 500 mg via ORAL

## 2016-06-25 MED ORDER — DIPHENHYDRAMINE HCL 25 MG PO CAPS
25.0000 mg | ORAL_CAPSULE | ORAL | Status: AC
  Administered 2016-06-25: 25 mg via ORAL

## 2016-06-25 MED ORDER — DIPHENHYDRAMINE HCL 25 MG PO CAPS
ORAL_CAPSULE | ORAL | Status: AC
  Administered 2016-06-25: 25 mg via ORAL
  Filled 2016-06-25: qty 1

## 2016-06-25 NOTE — H&P (View-Only) (Signed)
 06/12/2016 2:17 PM   Jerry Sandoval 01/15/1966 [id number]  Referring provider: Mark F Miller, MD [address] [facility] [place], [place] [address]  Chief Complaint  Patient presents with  . Cysto    HPI: 50-year-old male with episodes of gross hematuria, history of kidney stones who presents today to review CT urogram as well as undergo cystoscopy. Please see cystoscopy note for details, no evidence of pathology on cystoscopy today.   CT urogram doe show an approximately 8 mm stone in the right upper pole (~850 Hounsfield units, stone to skin distance 13 cm) as well as a 5 mm nonobstructing left lower pole calculus.  He denies any current flank pain, dysuria, or any further gross hematuria since his last visit. He denies any baseline voiding symptoms.  He does have a personal history kidney stones. He is able to pass proximally 4 mm stone in the past but with some difficulty. He's never required surgical intervention for his stones.   He is not on any blood thinners.   No chemical exposures.    Former smoker, less than 1 pack per week for 10 years.   No family history of GU history.    He does have a history of sleep apnea and wears CPAP at night.   PMH: Past Medical History:  Diagnosis Date  . Arteriovenous malformation of cerebral vessels   . Arthritis   . Asthma   . Carpal tunnel syndrome   . Diabetes mellitus without complication (HCC)   . Hyperlipidemia   . Hypertension   . Sleep apnea     Surgical History: Past Surgical History:  Procedure Laterality Date  . CEREBRAL ANGIOGRAM    . COLONOSCOPY WITH PROPOFOL N/A 07/09/2015   Procedure: COLONOSCOPY WITH PROPOFOL;  Surgeon: Martin U Skulskie, MD;  Location: [facility] ENDOSCOPY;  Service: Endoscopy;  Laterality: N/A;  . WISDOM TOOTH EXTRACTION      Home Medications:  Allergies as of 06/12/2016   No Known Allergies     Medication List       Accurate as of 06/12/16 11:59  PM. Always use your most recent med list.          albuterol 108 (90 Base) MCG/ACT inhaler Commonly known as:  PROVENTIL HFA;VENTOLIN HFA Inhale 2 puffs into the lungs every 6 (six) hours as needed for wheezing or shortness of breath.   allopurinol 300 MG tablet Commonly known as:  ZYLOPRIM Take 300 mg by mouth daily.   butalbital-acetaminophen-caffeine 50-325-40 MG tablet Commonly known as:  FIORICET, ESGIC Take 1 tablet by mouth every 6 (six) hours as needed for headache.   cetirizine 10 MG tablet Commonly known as:  ZYRTEC Take 10 mg by mouth daily.   cyclobenzaprine 10 MG tablet Commonly known as:  FLEXERIL Take 10 mg by mouth 2 (two) times daily as needed for muscle spasms.   diazepam 5 MG tablet Commonly known as:  VALIUM Take 5 mg by mouth daily.   indomethacin 50 MG capsule Commonly known as:  INDOCIN Take 50 mg by mouth 2 (two) times daily with a meal.   lisinopril-hydrochlorothiazide 20-12.5 MG tablet Commonly known as:  PRINZIDE,ZESTORETIC Take 0.5 tablets by mouth daily.   magnesium oxide 400 MG tablet Commonly known as:  MAG-OX Take 400 mg by mouth daily.   metFORMIN 500 MG tablet Commonly known as:  GLUCOPHAGE Take 1,000 mg by mouth 2 (two) times daily with a meal.   topiramate 50 MG tablet Commonly known   as:  TOPAMAX Take 50 mg by mouth 2 (two) times daily.   Vitamin D (Ergocalciferol) 50000 units Caps capsule Commonly known as:  DRISDOL Take 50,000 Units by mouth every 7 (seven) days.       Allergies: No Known Allergies  Family History: Family History  Problem Relation Age of Onset  . Hypertension Neg Hx   . Diabetes Neg Hx   . CAD Neg Hx   . Prostate cancer Neg Hx   . Bladder Cancer Neg Hx   . Kidney cancer Neg Hx     Social History:  reports that he has quit smoking. He uses smokeless tobacco. He reports that he does not drink alcohol or use drugs.  ROS: 12 point review of systems is negative other than as per history of  present illness.  Physical Exam: BP 105/67   Pulse 90   Ht 5' 11" (1.803 m)   Wt 295 lb (133.8 kg)   BMI 41.14 kg/m   Constitutional:  Alert and oriented, No acute distress. HEENT: [place] AT, moist mucus membranes.  Trachea midline, no masses. Cardiovascular: No clubbing, cyanosis, or edema. Respiratory: Normal respiratory effort, no increased work of breathing. GI: Abdomen is soft, nontender, nondistended, no abdominal masses GU: No CVA tenderness.  Bilateral descended testicles, grossly normal. Circumcised phallus with orthotopic meatus. Skin: No rashes, bruises or suspicious lesions.. Neurologic: Grossly intact, no focal deficits, moving all 4 extremities. Psychiatric: Normal mood and affect.  Laboratory Data: Lab Results  Component Value Date   WBC 6.9 03/24/2016   HGB 13.6 03/24/2016   HCT 40.2 03/24/2016   MCV 92.0 03/24/2016   PLT 224 03/24/2016    Lab Results  Component Value Date   CREATININE 1.08 03/24/2016    Urinalysis Results for orders placed or performed in visit on 06/12/16  Microscopic Examination  Result Value Ref Range   WBC, UA 0-5 0 - 5 /hpf   RBC, UA 0-2 0 - 2 /hpf   Epithelial Cells (non renal) None seen 0 - 10 /hpf   Crystals Present (A) N/A   Crystal Type Calcium Oxalate N/A   Bacteria, UA None seen None seen/Few  Urinalysis, Complete  Result Value Ref Range   Specific Gravity, UA 1.015 1.005 - 1.030   pH, UA 6.0 5.0 - 7.5   Color, UA Yellow Yellow   Appearance Ur Clear Clear   Leukocytes, UA Negative Negative   Protein, UA Negative Negative/Trace   Glucose, UA Negative Negative   Ketones, UA Negative Negative   RBC, UA Trace (A) Negative   Bilirubin, UA Negative Negative   Urobilinogen, Ur 0.2 0.2 - 1.0 mg/dL   Nitrite, UA Negative Negative   Microscopic Examination See below:     Pertinent Imaging: CLINICAL DATA:  Gross hematuria, intermittent pain x3 months  EXAM: CT ABDOMEN AND PELVIS WITHOUT AND WITH  CONTRAST  TECHNIQUE: Multidetector CT imaging of the abdomen and pelvis was performed following the standard protocol before and following the bolus administration of intravenous contrast.  CONTRAST:  125mL ISOVUE-300 IOPAMIDOL (ISOVUE-300) INJECTION 61%  COMPARISON:  03/25/2009  FINDINGS: Lower chest: Lung bases are clear.  Hepatobiliary: Moderate to severe hepatic steatosis with focal fatty sparing.  Layering tiny gallstones (series 2/ image 30), without associated inflammatory changes. No intrahepatic or extrahepatic ductal dilatation.  Pancreas: Within normal limits.  Spleen: Within normal limits.  Adrenals/Urinary Tract: Adrenal glands are within normal limits.  Bilateral renal cysts, including a dominant 2.1 cm cyst along the posterior interpolar   right kidney (series 6/ image 38). No enhancing renal lesions.  7 mm nonobstructing right upper pole renal calculus (series 2/ image 34). 5 mm nonobstructing left lower pole renal calculus (series 2/image 39). No ureteral or bladder calculi. No hydronephrosis.  On delayed imaging, there are no filling defects in the bilateral opacified proximal collecting systems, ureters, or bladder.  Bladder is within normal limits.  Stomach/Bowel: Stomach is within normal limits.  No evidence of bowel obstruction.  Normal appendix (series 6/ image 46).  Vascular/Lymphatic: No evidence of abdominal aortic aneurysm.  Atherosclerotic calcifications of the abdominal aorta and branch vessels.  No suspicious abdominopelvic lymphadenopathy.  Reproductive: Prostate is notable for dystrophic calcifications.  Other: No abdominopelvic ascites.  Musculoskeletal: Grade 1 spondylolisthesis at L5-S1.  Degenerative changes of the visualized thoracolumbar spine.  IMPRESSION: 7 mm nonobstructing right upper pole renal calculus. 5 mm nonobstructing left lower pole renal calculus. No ureteral or bladder calculi. No  hydronephrosis.  Bilateral renal cysts, including a dominant 2.1 cm posterior interpolar right renal cyst, benign (Bosniak I). No enhancing renal lesions.  Cholelithiasis, without associated inflammatory changes.  Moderate to severe hepatic steatosis.   Electronically Signed   By: Sriyesh  Krishnan M.D.   On: 05/11/2016 09:12  CT scan was personally reviewed today with the patient.  Assessment & Plan:   1. Gross hematuria Cystoscopy negative CT urogram as above - Urinalysis, Complete - ciprofloxacin (CIPRO) tablet 500 mg; Take 1 tablet (500 mg total) by mouth once. - lidocaine (XYLOCAINE) 2 % jelly 1 application; Place 1 application into the urethra once.  2. Kidney stones Multiple bilateral nonobstructing stones, up to 8 mm in the right upper pole. Options including surveillance versus intervention were reviewed in detail. Left lower pole stone is relatively small and has a less likely chance of passing in the near future. Right upper pole stone is fairly large and may be problematic.  We discussed various treatment options including ESWL vs. ureteroscopy, laser lithotripsy, and stent. We discussed the risks and benefits of both including bleeding, infection, damage to surrounding structures, efficacy with need for possible further intervention, and need for temporary ureteral stent.  He is interested in treating his right upper pole stone prophylactically. He would like to be scheduled for right ESWL.  Schedule R ESWL  Atalia Litzinger, MD  [facility] [address], [address] [place],  [address] [phone]  

## 2016-06-25 NOTE — Interval H&P Note (Signed)
History and Physical Interval Note:  06/25/2016 8:54 AM  Jerry Footmanhristopher W Schoen  has presented today for surgery, with the diagnosis of Kidney stone  The various methods of treatment have been discussed with the patient and family. After consideration of risks, benefits and other options for treatment, the patient has consented to  Procedure(s): EXTRACORPOREAL SHOCK WAVE LITHOTRIPSY (ESWL) (Right) as a surgical intervention .  The patient's history has been reviewed, patient examined, no change in status, stable for surgery.  I have reviewed the patient's chart and labs.  Questions were answered to the patient's satisfaction.    RRR CTAB  Vanna ScotlandAshley Guilherme Schwenke

## 2016-06-25 NOTE — Discharge Instructions (Signed)
See [facility] discharge instructions in chart.  AMBULATORY SURGERY  DISCHARGE INSTRUCTIONS   1) The drugs that you were given will stay in your system until tomorrow so for the next 24 hours you should not:  A) Drive an automobile B) Make any legal decisions C) Drink any alcoholic beverage   2) You may resume regular meals tomorrow.  Today it is better to start with liquids and gradually work up to solid foods.  You may eat anything you prefer, but it is better to start with liquids, then soup and crackers, and gradually work up to solid foods.   3) Please notify your doctor immediately if you have any unusual bleeding, trouble breathing, redness and pain at the surgery site, drainage, fever, or pain not relieved by medication.    4) Additional Instructions:        Please contact your physician with any problems or Same Day Surgery at [phone], Monday through Friday 6 am to 4 pm, or [place] [facility] Main number at [phone].  

## 2016-06-26 ENCOUNTER — Encounter: Payer: Self-pay | Admitting: Urology

## 2016-07-10 ENCOUNTER — Ambulatory Visit (INDEPENDENT_AMBULATORY_CARE_PROVIDER_SITE_OTHER): Payer: BLUE CROSS/BLUE SHIELD | Admitting: Urology

## 2016-07-10 ENCOUNTER — Ambulatory Visit
Admission: RE | Admit: 2016-07-10 | Discharge: 2016-07-10 | Disposition: A | Discharge status: Home / Self Care | Payer: BLUE CROSS/BLUE SHIELD | Source: Ambulatory Visit | Attending: Urology | Admitting: Urology

## 2016-07-10 ENCOUNTER — Encounter: Payer: Self-pay | Admitting: Urology

## 2016-07-10 VITALS — BP 120/78 | HR 89 | Ht 71.0 in | Wt 283.0 lb

## 2016-07-10 DIAGNOSIS — N200 Calculus of kidney: Secondary | ICD-10-CM

## 2016-07-10 DIAGNOSIS — R310 Gross hematuria: Secondary | ICD-10-CM

## 2016-07-10 NOTE — Progress Notes (Signed)
07/10/2016 9:34 AM   Jerry Sandoval 04/06/1965 [id number]  Referring provider: Danella PentonMiller, Mark F, MD [phone] [facility] [facility] [place], [place] [address]  Chief Complaint  Patient presents with  . Routine Post Op    2wk w/KUB    HPI: 51 year old male who returns today 2 weeks status post right ESWL for nonobstructing 8 mm calculus.  S/p work up for gross hematuria including CT urogram urogram as well cystoscopy 05/2016.  CT urogram showed an approximately 8 mm stone in the right upper pole (~850 Hounsfield units, stone to skin distance 13 cm) as well as a 5 mm nonobstructing left lower pole calculus.   He does have a personal history kidney stones. He is able to pass proximally 4 mm stone in the past but with some difficulty. He's never required surgical intervention for his stones.  Today, he reports that he did extremely well following the procedure. He had no significant discomfort. His past numerous fragments which he brings with him today.. No flank pain or gross hematuria.  He was previously on topamax yesterday due to concern by his PCP for stone formation.  He does have a history of gout and is on allopurinol.  His uric acid levels were previously elevated but have been well controlled on allopurinol.  He has this checked with his annual labs.  PMH: Past Medical History:  Diagnosis Date  . Arteriovenous malformation of cerebral vessels   . Arthritis   . Asthma   . Carpal tunnel syndrome   . Diabetes mellitus without complication (HCC)   . Hyperlipidemia   . Hypertension   . Sleep apnea     Surgical History: Past Surgical History:  Procedure Laterality Date  . CEREBRAL ANGIOGRAM    . COLONOSCOPY WITH PROPOFOL N/A 07/09/2015   Procedure: COLONOSCOPY WITH PROPOFOL;  Surgeon: Christena DeemMartin U Skulskie, MD;  [facility];  Service: Endoscopy;  Laterality: N/A;  . EXTRACORPOREAL SHOCK WAVE LITHOTRIPSY Right 06/25/2016   Procedure:  EXTRACORPOREAL SHOCK WAVE LITHOTRIPSY (ESWL);  Surgeon: Vanna ScotlandBrandon, Yina Riviere, MD;  Location: [facility] ORS;  Service: Urology;  Laterality: Right;  . WISDOM TOOTH EXTRACTION      Home Medications:  Allergies as of 07/10/2016   No Known Allergies     Medication List       Accurate as of 07/10/16  9:34 AM. Always use your most recent med list.          albuterol 108 (90 Base) MCG/ACT inhaler Commonly known as:  PROVENTIL HFA;VENTOLIN HFA Inhale 2 puffs into the lungs every 6 (six) hours as needed for wheezing or shortness of breath.   allopurinol 300 MG tablet Commonly known as:  ZYLOPRIM Take 300 mg by mouth daily.   cetirizine 10 MG tablet Commonly known as:  ZYRTEC Take 10 mg by mouth daily.   diazepam 5 MG tablet Commonly known as:  VALIUM Take 5 mg by mouth daily.   indomethacin 50 MG capsule Commonly known as:  INDOCIN Take 50 mg by mouth 2 (two) times daily with a meal.   lisinopril-hydrochlorothiazide 20-12.5 MG tablet Commonly known as:  PRINZIDE,ZESTORETIC Take 0.5 tablets by mouth daily.   magnesium oxide 400 MG tablet Commonly known as:  MAG-OX Take 400 mg by mouth daily.   metFORMIN 500 MG tablet Commonly known as:  GLUCOPHAGE Take 1,000 mg by mouth 2 (two) times daily with a meal.   primidone 50 MG tablet Commonly known as:  MYSOLINE   tamsulosin 0.4 MG  Caps capsule Commonly known as:  FLOMAX Take 1 capsule (0.4 mg total) by mouth daily.   Vitamin D (Ergocalciferol) 50000 units Caps capsule Commonly known as:  DRISDOL Take 50,000 Units by mouth every 7 (seven) days.       Allergies: No Known Allergies  Family History: Family History  Problem Relation Age of Onset  . Hypertension Neg Hx   . Diabetes Neg Hx   . CAD Neg Hx   . Prostate cancer Neg Hx   . Bladder Cancer Neg Hx   . Kidney cancer Neg Hx     Social History:  reports that he has quit smoking. He uses smokeless tobacco. He reports that he does not drink alcohol or use  drugs.  ROS: 12 point review of systems is negative other than as per history of present illness.  Physical Exam: BP 120/78   Pulse 89   Ht 5\' 11"  (1.803 m)   Wt 283 lb (128.4 kg)   BMI 39.47 kg/m   Constitutional:  Alert and oriented, No acute distress. HEENT: [place] AT, moist mucus membranes.  Trachea midline, no masses. Cardiovascular: No clubbing, cyanosis, or edema. Respiratory: Normal respiratory effort, no increased work of breathing. GI: Abdomen is soft, nontender, nondistended, no abdominal masses GU: No CVA tenderness.  Skin: No rashes, bruises or suspicious lesions.. Neurologic: Grossly intact, no focal deficits, moving all 4 extremities. Psychiatric: Normal mood and affect.  Laboratory Data: Lab Results  Component Value Date   WBC 6.9 03/24/2016   HGB 13.6 03/24/2016   HCT 40.2 03/24/2016   MCV 92.0 03/24/2016   PLT 224 03/24/2016    Lab Results  Component Value Date   CREATININE 1.08 03/24/2016    Urinalysis N/a  Pertinent Imaging: CLINICAL DATA:  Nephrolithiasis.  EXAM: ABDOMEN - 1 VIEW  COMPARISON:  06/25/2016.  FINDINGS: Bilateral nephrolithiasis again noted. No interim change. No bowel distention. Nonspecific air-filled loops of small large bowel noted. No acute bony abnormality .  IMPRESSION: Stable bilateral nephrolithiasis.  No evidence of urolithiasis.   Electronically Signed   By: Maisie Fus  Register   On: 07/10/2016 08:36  KUB reviewed personally with the patient. Although the report reads stable calculi, the right stone is clearly changed, less dense and no longer circumscribed, now measuring ~ 3mm.    Assessment & Plan:   1. Kidney stones S/p R ESWL- KUB today shows significant change and is passed numerous stone fragments consistent with good fragmentation. Suspect that there is residual dust in the right lower pole, encouraged hydration and continued observation.  Warning symptoms reviewed.    We discussed general stone  prevention techniques including drinking plenty water with goal of producing 2.5 L urine daily, increased citric acid intake, avoidance of high oxalate containing foods, and decreased salt intake.  Information about dietary recommendations given today.   Given his personal history of stones, bilateral stones, I have offered a 24-hour urine metabolic workup which he would like to proceed with. We will arrange for him to do this 2 months return in 3 months for review of this.  Stones sent for stone analysis.  2. Gross hematuria Likely secondary to #1 S/p hematuria work up 05/2016 Recheck UA next visit   Return in about 3 months (around 10/10/2016) for f/u litholink, UA.   Vanna Scotland, MD  [facility] [address] [place], [place] [phone]

## 2016-08-07 ENCOUNTER — Telehealth: Payer: Self-pay | Admitting: Urology

## 2016-08-07 NOTE — Telephone Encounter (Signed)
Spoke to patient and informed him that the [employer] paper was faxed on 07/10/2016 and the box was checked to delay shipment until 09/16/2016. He should expect his kit after 09/16/16. He voiced understanding.

## 2016-08-07 NOTE — Telephone Encounter (Signed)
Pt called office stating that he was instructed to do a 24 hr urine before his next appt with Dr. Apolinar JunesBrandon, however the pt voices concerns that he still has not received the LithoLink package in the mail.

## 2016-09-21 ENCOUNTER — Other Ambulatory Visit: Payer: BLUE CROSS/BLUE SHIELD

## 2016-09-28 ENCOUNTER — Other Ambulatory Visit: Payer: Self-pay | Admitting: Urology

## 2016-10-09 ENCOUNTER — Ambulatory Visit: Payer: BLUE CROSS/BLUE SHIELD | Admitting: Urology

## 2016-10-16 ENCOUNTER — Encounter: Payer: Self-pay | Admitting: Urology

## 2016-10-16 ENCOUNTER — Ambulatory Visit (INDEPENDENT_AMBULATORY_CARE_PROVIDER_SITE_OTHER): Payer: BLUE CROSS/BLUE SHIELD | Admitting: Urology

## 2016-10-16 VITALS — BP 111/74 | HR 82 | Ht 71.0 in | Wt 290.0 lb

## 2016-10-16 DIAGNOSIS — R8299 Other abnormal findings in urine: Secondary | ICD-10-CM | POA: Diagnosis not present

## 2016-10-16 DIAGNOSIS — R82994 Hypercalciuria: Secondary | ICD-10-CM

## 2016-10-16 DIAGNOSIS — Z87442 Personal history of urinary calculi: Secondary | ICD-10-CM

## 2016-10-16 DIAGNOSIS — R82991 Hypocitraturia: Secondary | ICD-10-CM

## 2016-10-16 LAB — URINALYSIS, COMPLETE
Bilirubin, UA: NEGATIVE
Glucose, UA: NEGATIVE
Ketones, UA: NEGATIVE
Leukocytes, UA: NEGATIVE
Nitrite, UA: NEGATIVE
Specific Gravity, UA: >1.03 — ABNORMAL HIGH (ref 1.005–1.030)
Urobilinogen, Ur: 0.2 mg/dL (ref 0.2–1.0)
pH, UA: 6 (ref 5.0–7.5)

## 2016-10-16 NOTE — Progress Notes (Signed)
10/16/2016 1:39 PM   Jerry Sandoval 06/11/1965 [id number]  Referring provider: Danella PentonMiller, Mark F, MD [phone] [facility] [facility] [place], [place] [address]  Chief Complaint  Patient presents with  . Nephrolithiasis    Litho link    HPI: 51 year old male with a history of nephrolithiasis who returns today to discuss today for her urine results.  S/p work up for gross hematuria including CT urogram urogram as well cystoscopy 05/2016.  CT urogram showed an approximately 8 mm stone in the right upper pole (~850 Hounsfield units, stone to skin distance 13 cm) as well as a 5 mm nonobstructing left lower pole calculus.  He underwent successful shockwave lithotripsy for the larger 8 mm stone.  No flank pain or gross hematuria since last visit. He's been doing very well.   He does have a personal history kidney stones. He is able to pass proximally 4 mm stone in the past but with some difficulty. He's never required surgical intervention for his stones.  Today, he reports that he did extremely well following the procedure. He had no significant discomfort. His past numerous fragments which he brings with him today.. No flank pain or gross hematuria.  He was previously on topamax yesterday due to concern by his PCP for stone formation.  He does have a history of gout and is on allopurinol.  His uric acid levels were previously elevated but have been well controlled on allopurinol.  He has this checked with his annual labs.  Litholink shows excellent urinary volume 2.81 L, elevated urinary calcium of 394 mg per day, low urinary citrate 189 mg per day, urinary oxalate 47 mg per day, urinary pH 5.530.  Stone analysis was previously sent but for unclear reasons, results are not available.   PMH: Past Medical History:  Diagnosis Date  . Arteriovenous malformation of cerebral vessels   . Arthritis   . Asthma   . Carpal tunnel syndrome   . Diabetes mellitus  without complication (HCC)   . Hyperlipidemia   . Hypertension   . Sleep apnea     Surgical History: Past Surgical History:  Procedure Laterality Date  . CEREBRAL ANGIOGRAM    . COLONOSCOPY WITH PROPOFOL N/A 07/09/2015   Procedure: COLONOSCOPY WITH PROPOFOL;  Surgeon: Christena DeemMartin U Skulskie, MD;  [facility];  Service: Endoscopy;  Laterality: N/A;  . EXTRACORPOREAL SHOCK WAVE LITHOTRIPSY Right 06/25/2016   Procedure: EXTRACORPOREAL SHOCK WAVE LITHOTRIPSY (ESWL);  Surgeon: Vanna ScotlandBrandon, Chalice Philbert, MD;  Location: [facility] ORS;  Service: Urology;  Laterality: Right;  . WISDOM TOOTH EXTRACTION      Home Medications:  Allergies as of 10/16/2016   No Known Allergies     Medication List       Accurate as of 10/16/16  1:39 PM. Always use your most recent med list.          albuterol 108 (90 Base) MCG/ACT inhaler Commonly known as:  PROVENTIL HFA;VENTOLIN HFA Inhale 2 puffs into the lungs every 6 (six) hours as needed for wheezing or shortness of breath.   allopurinol 300 MG tablet Commonly known as:  ZYLOPRIM Take 300 mg by mouth daily.   CENTRUM SILVER 50+MEN PO Take by mouth.   cetirizine 10 MG tablet Commonly known as:  ZYRTEC Take 10 mg by mouth daily.   diazepam 5 MG tablet Commonly known as:  VALIUM Take 5 mg by mouth daily.   indomethacin 50 MG capsule Commonly known as:  INDOCIN Take 50 mg by  mouth 2 (two) times daily with a meal.   lisinopril-hydrochlorothiazide 20-12.5 MG tablet Commonly known as:  PRINZIDE,ZESTORETIC Take 0.5 tablets by mouth daily.   magnesium oxide 400 MG tablet Commonly known as:  MAG-OX Take 400 mg by mouth daily.   metFORMIN 500 MG tablet Commonly known as:  GLUCOPHAGE Take 1,000 mg by mouth 2 (two) times daily with a meal.   primidone 50 MG tablet Commonly known as:  MYSOLINE   tamsulosin 0.4 MG Caps capsule Commonly known as:  FLOMAX Take 1 capsule (0.4 mg total) by mouth daily.   Vitamin D (Ergocalciferol) 50000 units Caps  capsule Commonly known as:  DRISDOL Take 50,000 Units by mouth every 7 (seven) days.            Discharge Care Instructions        Start     Ordered   10/16/16 0000  Urinalysis, Complete     10/16/16 0832   10/16/16 0000  DG Abd 1 View    Question Answer Comment  Reason for Exam (SYMPTOM  OR DIAGNOSIS REQUIRED) kidney stones   Preferred imaging location? [facility]      10/16/16 0854   10/16/16 0000  Basic metabolic panel     10/16/16 0854   10/16/16 0000  PTH, Intact and Calcium     10/16/16 0854      Allergies: No Known Allergies  Family History: Family History  Problem Relation Age of Onset  . Hypertension Neg Hx   . Diabetes Neg Hx   . CAD Neg Hx   . Prostate cancer Neg Hx   . Bladder Cancer Neg Hx   . Kidney cancer Neg Hx     Social History:  reports that he has quit smoking. He uses smokeless tobacco. He reports that he does not drink alcohol or use drugs.  ROS: 12 point review of systems is negative other than as per history of present illness.  Physical Exam: BP 111/74 (BP Location: Left Arm, Patient Position: Sitting, Cuff Size: Large)   Pulse 82   Ht 5\' 11"  (1.803 m)   Wt 290 lb (131.5 kg)   BMI 40.45 kg/m   Constitutional:  Alert and oriented, No acute distress. HEENT: [place] AT, moist mucus membranes.  Trachea midline, no masses. Cardiovascular: No clubbing, cyanosis, or edema. Respiratory: Normal respiratory effort, no increased work of breathing. GI: Abdomen is soft, nontender, nondistended, no abdominal masses GU: No CVA tenderness.  Skin: No rashes, bruises or suspicious lesions.. Neurologic: Grossly intact, no focal deficits, moving all 4 extremities. Psychiatric: Normal mood and affect.  Laboratory Data: Lab Results  Component Value Date   WBC 6.9 03/24/2016   HGB 13.6 03/24/2016   HCT 40.2 03/24/2016   MCV 92.0 03/24/2016   PLT 224 03/24/2016    Lab Results  Component Value Date   CREATININE 1.08 03/24/2016     Urinalysis Results for orders placed or performed in visit on 10/16/16  Urinalysis, Complete  Result Value Ref Range   Specific Gravity, UA >1.030 (H) 1.005 - 1.030   pH, UA 6.0 5.0 - 7.5   Color, UA Yellow Yellow   Appearance Ur Clear Clear   Leukocytes, UA Negative Negative   Protein, UA 1+ (A) Negative/Trace   Glucose, UA Negative Negative   Ketones, UA Negative Negative   RBC, UA Trace (A) Negative   Bilirubin, UA Negative Negative   Urobilinogen, Ur 0.2 0.2 - 1.0 mg/dL   Nitrite, UA Negative Negative  Pertinent Imaging: No new interval imaging  Assessment & Plan:   1. Recurrent nephrolithiasis 24 hour urine results reviewed as above Small bilateral stones, observing  2. Hypercalciuria Discussed primary metabolic derangement, PTH order to rule out hyperparathyroidism Recommend Urocit-K, 15 mg twice a day with recheck a potassium in 1 week Consider indapamide 2.5 mg once blood pressure medications are adjusted (currently on very low-dose of hydrochlorothiazide, 12.5, as a combined medication with lisinopril) We'll discuss blood pressure medication changes with PCP Repeat litholink on above meds to see if effective  2. Gross hematuria Likely secondary to #1 S/p hematuria work up 05/2016 g  Return in about 3 months (around 01/15/2017) for KUB, repeat Litholink  (also lab visit 1 week for PTH/ BMP).   Vanna Scotland, MD  [facility] [address], [address] [place], [place] [address] [phone]

## 2016-10-23 ENCOUNTER — Other Ambulatory Visit: Payer: BLUE CROSS/BLUE SHIELD

## 2016-10-23 ENCOUNTER — Encounter: Payer: Self-pay | Admitting: Urology

## 2016-10-23 ENCOUNTER — Telehealth: Payer: Self-pay | Admitting: Urology

## 2016-10-23 DIAGNOSIS — N200 Calculus of kidney: Secondary | ICD-10-CM | POA: Insufficient documentation

## 2016-10-23 DIAGNOSIS — R82991 Hypocitraturia: Secondary | ICD-10-CM

## 2016-10-23 DIAGNOSIS — R82994 Hypercalciuria: Secondary | ICD-10-CM

## 2016-10-23 NOTE — Telephone Encounter (Signed)
Patient said he spoke with his PCP and was told that he was taking him off of the lisinopril HCTZ and is just going to be on lisinopril 10mg  and that you could put him on thiazide medication whenever you felt necessary.   Thanks, Marcelino DusterMichelle

## 2016-10-24 LAB — BASIC METABOLIC PANEL
BUN/Creatinine Ratio: 15 (ref 9–20)
BUN: 15 mg/dL (ref 6–24)
CO2: 24 mmol/L (ref 20–29)
Calcium: 10.3 mg/dL — ABNORMAL HIGH (ref 8.7–10.2)
Chloride: 98 mmol/L (ref 96–106)
Creatinine, Ser: 0.98 mg/dL (ref 0.76–1.27)
GFR calc Af Amer: 105 mL/min/{1.73_m2} (ref >59)
GFR calc non Af Amer: 91 mL/min/{1.73_m2} (ref >59)
Glucose: 102 mg/dL — ABNORMAL HIGH (ref 65–99)
Potassium: 5.1 mmol/L (ref 3.5–5.2)
Sodium: 140 mmol/L (ref 134–144)

## 2016-10-24 LAB — PTH, INTACT AND CALCIUM: PTH: 30 pg/mL (ref 15–65)

## 2016-10-25 ENCOUNTER — Other Ambulatory Visit: Payer: Self-pay | Admitting: Urology

## 2016-10-25 MED ORDER — INDAPAMIDE 2.5 MG PO TABS: 2.5000 mg | ORAL_TABLET | Freq: Every day | ORAL | 11 refills | Status: DC

## 2016-10-25 MED ORDER — POTASSIUM CITRATE ER 10 MEQ (1080 MG) PO TBCR: 10.0000 meq | EXTENDED_RELEASE_TABLET | Freq: Two times a day (BID) | ORAL | 0 refills | Status: DC

## 2016-10-25 NOTE — Progress Notes (Unsigned)
Please arrange for repeat BMP in 2 weeks.  Script for indapamide and urocit sent to pharmacy.    Vanna ScotlandAshley Kaiulani Sitton, MD

## 2016-12-28 ENCOUNTER — Other Ambulatory Visit: Payer: BLUE CROSS/BLUE SHIELD

## 2017-01-11 ENCOUNTER — Other Ambulatory Visit: Payer: Self-pay | Admitting: Urology

## 2017-01-14 ENCOUNTER — Other Ambulatory Visit: Payer: Self-pay | Admitting: Orthopedic Surgery

## 2017-01-14 DIAGNOSIS — M25512 Pain in left shoulder: Secondary | ICD-10-CM

## 2017-01-15 ENCOUNTER — Ambulatory Visit: Payer: BLUE CROSS/BLUE SHIELD | Admitting: Urology

## 2017-01-19 ENCOUNTER — Ambulatory Visit (INDEPENDENT_AMBULATORY_CARE_PROVIDER_SITE_OTHER): Payer: BLUE CROSS/BLUE SHIELD | Admitting: Urology

## 2017-01-19 ENCOUNTER — Ambulatory Visit
Admission: RE | Admit: 2017-01-19 | Discharge: 2017-01-19 | Disposition: A | Discharge status: Home / Self Care | Payer: BLUE CROSS/BLUE SHIELD | Source: Ambulatory Visit | Attending: Urology | Admitting: Urology

## 2017-01-19 ENCOUNTER — Encounter: Payer: Self-pay | Admitting: Urology

## 2017-01-19 VITALS — BP 106/68 | HR 84 | Ht 71.0 in | Wt 295.0 lb

## 2017-01-19 DIAGNOSIS — Z87442 Personal history of urinary calculi: Secondary | ICD-10-CM | POA: Insufficient documentation

## 2017-01-19 DIAGNOSIS — N200 Calculus of kidney: Secondary | ICD-10-CM | POA: Diagnosis not present

## 2017-01-19 DIAGNOSIS — R82994 Hypercalciuria: Secondary | ICD-10-CM

## 2017-01-19 NOTE — Progress Notes (Signed)
01/19/2017 1:27 PM   Jerry Footmanhristopher W Enck 07/28/1965 [id number]  Referring provider: Danella PentonMiller, Mark F, MD [phone] [facility] [facility] [place], [place] [address]  Chief Complaint  Patient presents with  . Nephrolithiasis    41month    HPI: 51 year old male with a history of nephrolithiasis who returns today for a KUB and repeat for urine results.  S/p work up for gross hematuria including CT urogram urogram as well cystoscopy 05/2016.  CT urogram showed an approximately 8 mm stone in the right upper pole (~850 Hounsfield units, stone to skin distance 13 cm) as well as a 5 mm nonobstructing left lower pole calculus.  He underwent successful shockwave lithotripsy for the larger 8 mm stone.  He does have a personal history kidney stones.   He was previously on topamax yesterday due to concern by his PCP for stone formation.  He does have a history of gout and is on allopurinol.  His uric acid levels were previously elevated but have been well controlled on allopurinol.  He since been started on indapamide.  He was also given potassium citrate, samples for 1 month but never filled the prescription.  He is not currently taking this medication.  Initial Litholink shows excellent urinary volume 2.81 L, elevated urinary calcium of 394 mg per day, low urinary citrate 189 mg per day, urinary oxalate 47 mg per day, urinary pH 5.530.  He has since been started on indapamide.  He is repeated his 24-hour urine.  This shows his urinary volume is 4.25 L.  Urinary oxalate is significantly elevated at 68 mg/day.  Urinary calcium has improved to 128.  Urinary citrate also improved to 484 mg/day.  Urinary pH remains low at 5.634.   He was on indapamide only at the time of the 24-hour urine.  Stone analysis was previously sent but for unclear reasons, results are not available.  Notes that he has been told his stones were calcium in the past.  Since his last visit, he denies any  flank pain or urinary symptoms other than frequency.  He has been drinking a copious amount of water.  KUB today shows unchanged left 5 mm stone, no right-sided stones.   PMH: Past Medical History:  Diagnosis Date  . Arteriovenous malformation of cerebral vessels   . Arthritis   . Asthma   . Carpal tunnel syndrome   . Diabetes mellitus without complication (HCC)   . Hyperlipidemia   . Hypertension   . Sleep apnea     Surgical History: Past Surgical History:  Procedure Laterality Date  . CEREBRAL ANGIOGRAM    . COLONOSCOPY WITH PROPOFOL N/A 07/09/2015   Procedure: COLONOSCOPY WITH PROPOFOL;  Surgeon: Christena DeemMartin U Skulskie, MD;  [facility];  Service: Endoscopy;  Laterality: N/A;  . EXTRACORPOREAL SHOCK WAVE LITHOTRIPSY Right 06/25/2016   Procedure: EXTRACORPOREAL SHOCK WAVE LITHOTRIPSY (ESWL);  Surgeon: Vanna ScotlandBrandon, Adeeb Konecny, MD;  Location: [facility] ORS;  Service: Urology;  Laterality: Right;  . WISDOM TOOTH EXTRACTION      Home Medications:  Allergies as of 01/19/2017   No Known Allergies     Medication List        Accurate as of 01/19/17  1:27 PM. Always use your most recent med list.          albuterol 108 (90 Base) MCG/ACT inhaler Commonly known as:  PROVENTIL HFA;VENTOLIN HFA Inhale 2 puffs into the lungs every 6 (six) hours as needed for wheezing or shortness of breath.  allopurinol 300 MG tablet Commonly known as:  ZYLOPRIM Take 300 mg by mouth daily.   CENTRUM SILVER 50+MEN PO Take by mouth.   cetirizine 10 MG tablet Commonly known as:  ZYRTEC Take 10 mg by mouth daily.   diazepam 5 MG tablet Commonly known as:  VALIUM Take 5 mg by mouth daily.   indapamide 2.5 MG tablet Commonly known as:  LOZOL Take 1 tablet (2.5 mg total) by mouth daily.   indomethacin 50 MG capsule Commonly known as:  INDOCIN Take 50 mg by mouth 2 (two) times daily with a meal.   lisinopril 10 MG tablet Commonly known as:  PRINIVIL,ZESTRIL Take 10 mg by mouth daily.     metFORMIN 500 MG tablet Commonly known as:  GLUCOPHAGE Take 1,000 mg by mouth 2 (two) times daily with a meal.       Allergies: No Known Allergies  Family History: Family History  Problem Relation Age of Onset  . Hypertension Neg Hx   . Diabetes Neg Hx   . CAD Neg Hx   . Prostate cancer Neg Hx   . Bladder Cancer Neg Hx   . Kidney cancer Neg Hx     Social History:  reports that he has quit smoking. He uses smokeless tobacco. He reports that he does not drink alcohol or use drugs.  ROS: 12 point review of systems is negative other than as per history of present illness.  Physical Exam: BP 106/68   Pulse 84   Ht 5\' 11"  (1.803 m)   Wt 295 lb (133.8 kg)   BMI 41.14 kg/m   Constitutional:  Alert and oriented, No acute distress. HEENT: [place] AT, moist mucus membranes.  Trachea midline, no masses. Cardiovascular: No clubbing, cyanosis, or edema. Respiratory: Normal respiratory effort, no increased work of breathing. GI: Abdomen is soft, nontender, nondistended, no abdominal masses GU: No CVA tenderness.  Skin: No rashes, bruises or suspicious lesions.. Neurologic: Grossly intact, no focal deficits, moving all 4 extremities. Psychiatric: Normal mood and affect.  Laboratory Data: Lab Results  Component Value Date   WBC 6.9 03/24/2016   HGB 13.6 03/24/2016   HCT 40.2 03/24/2016   MCV 92.0 03/24/2016   PLT 224 03/24/2016    Lab Results  Component Value Date   CREATININE 0.98 10/23/2016    Urinalysis 24 hour urine results personally today with the patient.  Pertinent Imaging: CLINICAL DATA:  Nephrolithiasis  EXAM: ABDOMEN - 1 VIEW  COMPARISON:  07/10/2016  FINDINGS: 5 mm left lower pole renal calculus unchanged. No other renal calculi  Normal bowel gas pattern with stool in the right colon. No acute skeletal abnormality.  IMPRESSION: 5 mm left lower pole renal calculus unchanged.   Electronically Signed   By: Marlan Palauharles  Clark M.D.   On:  01/19/2017 09:16  KUB personally reviewed today with the patient.  Assessment & Plan:   1. Recurrent nephrolithiasis 24 hour urine results reviewed as above 5 mm left lower pole stone, asymptomatic, observing for now  2. Hypercalciuria Improved hypercalciuria with indapamide 2.5 mg Continue this medication Given no interval stone formation or growth on this medication, will defer additional treatment with potassium citrate at this time   Return in about 1 year (around 01/19/2018) for KUB.  Or sooner as needed   Vanna ScotlandAshley Daya Dutt, MD  [facility] [address] [place], [place] [address] [phone]

## 2017-01-29 ENCOUNTER — Ambulatory Visit
Admission: RE | Admit: 2017-01-29 | Discharge: 2017-01-29 | Disposition: A | Discharge status: Home / Self Care | Payer: BLUE CROSS/BLUE SHIELD | Source: Ambulatory Visit | Attending: Orthopedic Surgery | Admitting: Orthopedic Surgery

## 2017-01-29 DIAGNOSIS — M25512 Pain in left shoulder: Secondary | ICD-10-CM

## 2017-10-21 ENCOUNTER — Other Ambulatory Visit: Payer: Self-pay | Admitting: Urology

## 2017-10-29 ENCOUNTER — Other Ambulatory Visit: Payer: Self-pay

## 2017-10-29 ENCOUNTER — Encounter: Payer: Self-pay | Admitting: Emergency Medicine

## 2017-10-29 ENCOUNTER — Ambulatory Visit
Admission: RE | Admit: 2017-10-29 | Discharge: 2017-10-29 | Disposition: A | Discharge status: Home / Self Care | Payer: BLUE CROSS/BLUE SHIELD | Source: Ambulatory Visit | Attending: Internal Medicine | Admitting: Internal Medicine

## 2017-10-29 ENCOUNTER — Inpatient Hospital Stay
Admission: EM | Admit: 2017-10-29 | Discharge: 2017-11-01 | DRG: 418 | Disposition: A | Discharge status: Home / Self Care | Payer: BLUE CROSS/BLUE SHIELD | Attending: Family Medicine | Admitting: Family Medicine

## 2017-10-29 ENCOUNTER — Inpatient Hospital Stay: Payer: BLUE CROSS/BLUE SHIELD

## 2017-10-29 ENCOUNTER — Other Ambulatory Visit: Payer: Self-pay | Admitting: Internal Medicine

## 2017-10-29 DIAGNOSIS — Z79899 Other long term (current) drug therapy: Secondary | ICD-10-CM | POA: Diagnosis not present

## 2017-10-29 DIAGNOSIS — E785 Hyperlipidemia, unspecified: Secondary | ICD-10-CM | POA: Diagnosis present

## 2017-10-29 DIAGNOSIS — R7989 Other specified abnormal findings of blood chemistry: Secondary | ICD-10-CM

## 2017-10-29 DIAGNOSIS — K810 Acute cholecystitis: Secondary | ICD-10-CM

## 2017-10-29 DIAGNOSIS — I10 Essential (primary) hypertension: Secondary | ICD-10-CM | POA: Diagnosis present

## 2017-10-29 DIAGNOSIS — Z720 Tobacco use: Secondary | ICD-10-CM | POA: Diagnosis not present

## 2017-10-29 DIAGNOSIS — R945 Abnormal results of liver function studies: Secondary | ICD-10-CM | POA: Diagnosis not present

## 2017-10-29 DIAGNOSIS — E119 Type 2 diabetes mellitus without complications: Secondary | ICD-10-CM | POA: Diagnosis present

## 2017-10-29 DIAGNOSIS — G20C Parkinsonism, unspecified: Secondary | ICD-10-CM | POA: Insufficient documentation

## 2017-10-29 DIAGNOSIS — R1011 Right upper quadrant pain: Secondary | ICD-10-CM

## 2017-10-29 DIAGNOSIS — Z6841 Body Mass Index (BMI) 40.0 and over, adult: Secondary | ICD-10-CM

## 2017-10-29 DIAGNOSIS — K8000 Calculus of gallbladder with acute cholecystitis without obstruction: Secondary | ICD-10-CM | POA: Diagnosis not present

## 2017-10-29 DIAGNOSIS — J45909 Unspecified asthma, uncomplicated: Secondary | ICD-10-CM | POA: Diagnosis present

## 2017-10-29 DIAGNOSIS — K8020 Calculus of gallbladder without cholecystitis without obstruction: Secondary | ICD-10-CM

## 2017-10-29 DIAGNOSIS — K429 Umbilical hernia without obstruction or gangrene: Secondary | ICD-10-CM | POA: Diagnosis present

## 2017-10-29 DIAGNOSIS — R109 Unspecified abdominal pain: Secondary | ICD-10-CM | POA: Diagnosis present

## 2017-10-29 DIAGNOSIS — K8050 Calculus of bile duct without cholangitis or cholecystitis without obstruction: Secondary | ICD-10-CM

## 2017-10-29 DIAGNOSIS — K8010 Calculus of gallbladder with chronic cholecystitis without obstruction: Secondary | ICD-10-CM | POA: Diagnosis present

## 2017-10-29 DIAGNOSIS — Z7984 Long term (current) use of oral hypoglycemic drugs: Secondary | ICD-10-CM | POA: Diagnosis not present

## 2017-10-29 DIAGNOSIS — R17 Unspecified jaundice: Secondary | ICD-10-CM | POA: Diagnosis not present

## 2017-10-29 DIAGNOSIS — G4733 Obstructive sleep apnea (adult) (pediatric): Secondary | ICD-10-CM | POA: Diagnosis present

## 2017-10-29 DIAGNOSIS — G20 Parkinson's disease: Secondary | ICD-10-CM | POA: Insufficient documentation

## 2017-10-29 LAB — COMPREHENSIVE METABOLIC PANEL
ALT: 377 U/L — ABNORMAL HIGH (ref 0–44)
AST: 462 U/L — ABNORMAL HIGH (ref 15–41)
Albumin: 4.7 g/dL (ref 3.5–5.0)
Alkaline Phosphatase: 72 U/L (ref 38–126)
Anion gap: 10 (ref 5–15)
BUN: 12 mg/dL (ref 6–20)
CO2: 27 mmol/L (ref 22–32)
Calcium: 10.3 mg/dL (ref 8.9–10.3)
Chloride: 100 mmol/L (ref 98–111)
Creatinine, Ser: 1.03 mg/dL (ref 0.61–1.24)
GFR calc Af Amer: >60 mL/min (ref >60)
GFR calc non Af Amer: >60 mL/min (ref >60)
Glucose, Bld: 107 mg/dL — ABNORMAL HIGH (ref 70–99)
Potassium: 3.8 mmol/L (ref 3.5–5.1)
Sodium: 137 mmol/L (ref 135–145)
Total Bilirubin: 2.6 mg/dL — ABNORMAL HIGH (ref 0.3–1.2)
Total Protein: 8.1 g/dL (ref 6.5–8.1)

## 2017-10-29 LAB — URINALYSIS, COMPLETE (UACMP) WITH MICROSCOPIC
Bacteria, UA: NONE SEEN
Bilirubin Urine: NEGATIVE
Glucose, UA: NEGATIVE mg/dL
Ketones, ur: NEGATIVE mg/dL
Leukocytes, UA: NEGATIVE
Nitrite: NEGATIVE
Protein, ur: 30 mg/dL — AB
Specific Gravity, Urine: 1.012 (ref 1.005–1.030)
Squamous Epithelial / LPF: NONE SEEN (ref 0–5)
WBC, UA: NONE SEEN WBC/hpf (ref 0–5)
pH: 6 (ref 5.0–8.0)

## 2017-10-29 LAB — CBC WITH DIFFERENTIAL/PLATELET
Basophils Absolute: 0 10*3/uL (ref 0–0.1)
Basophils Relative: 1 %
Eosinophils Absolute: 0.1 10*3/uL (ref 0–0.7)
Eosinophils Relative: 1 %
HCT: 46.2 % (ref 40.0–52.0)
Hemoglobin: 16 g/dL (ref 13.0–18.0)
Lymphocytes Relative: 20 %
Lymphs Abs: 1.6 10*3/uL (ref 1.0–3.6)
MCH: 32 pg (ref 26.0–34.0)
MCHC: 34.6 g/dL (ref 32.0–36.0)
MCV: 92.4 fL (ref 80.0–100.0)
Monocytes Absolute: 0.7 10*3/uL (ref 0.2–1.0)
Monocytes Relative: 9 %
Neutro Abs: 5.7 10*3/uL (ref 1.4–6.5)
Neutrophils Relative %: 69 %
Platelets: 240 10*3/uL (ref 150–440)
RBC: 5 MIL/uL (ref 4.40–5.90)
RDW: 14 % (ref 11.5–14.5)
WBC: 8.2 10*3/uL (ref 3.8–10.6)

## 2017-10-29 LAB — GLUCOSE, CAPILLARY
Glucose-Capillary: 108 mg/dL — ABNORMAL HIGH (ref 70–99)
Glucose-Capillary: 115 mg/dL — ABNORMAL HIGH (ref 70–99)

## 2017-10-29 LAB — TROPONIN I: Troponin I: <0.03 ng/mL (ref <0.03)

## 2017-10-29 LAB — BILIRUBIN, DIRECT: Bilirubin, Direct: 1.2 mg/dL — ABNORMAL HIGH (ref 0.0–0.2)

## 2017-10-29 LAB — LIPASE, BLOOD: Lipase: 43 U/L (ref 11–51)

## 2017-10-29 MED ORDER — MORPHINE SULFATE (PF) 4 MG/ML IV SOLN
4.0000 mg | Freq: Once | INTRAVENOUS | Status: AC
  Administered 2017-10-29: 4 mg via INTRAVENOUS
  Filled 2017-10-29: qty 1

## 2017-10-29 MED ORDER — INDAPAMIDE 2.5 MG PO TABS
2.5000 mg | ORAL_TABLET | Freq: Every day | ORAL | Status: DC
  Administered 2017-10-30 – 2017-11-01 (×2): 2.5 mg via ORAL
  Filled 2017-10-29 (×3): qty 1

## 2017-10-29 MED ORDER — ONDANSETRON HCL 4 MG/2ML IJ SOLN: 4.0000 mg | Freq: Four times a day (QID) | INTRAMUSCULAR | Status: DC | PRN

## 2017-10-29 MED ORDER — ACETAMINOPHEN 650 MG RE SUPP: 650.0000 mg | Freq: Four times a day (QID) | RECTAL | Status: DC | PRN

## 2017-10-29 MED ORDER — ONDANSETRON HCL 4 MG/2ML IJ SOLN
4.0000 mg | INTRAMUSCULAR | Status: AC
  Administered 2017-10-29: 4 mg via INTRAVENOUS
  Filled 2017-10-29: qty 2

## 2017-10-29 MED ORDER — SODIUM CHLORIDE 0.9 % IV SOLN
INTRAVENOUS | Status: DC
  Administered 2017-10-29 – 2017-10-31 (×6): via INTRAVENOUS

## 2017-10-29 MED ORDER — ALLOPURINOL 100 MG PO TABS
300.0000 mg | ORAL_TABLET | Freq: Every day | ORAL | Status: DC
  Administered 2017-10-30 – 2017-11-01 (×2): 300 mg via ORAL
  Filled 2017-10-29 (×2): qty 3

## 2017-10-29 MED ORDER — MORPHINE SULFATE (PF) 2 MG/ML IV SOLN
2.0000 mg | INTRAVENOUS | Status: DC | PRN
  Administered 2017-10-30 – 2017-10-31 (×3): 2 mg via INTRAVENOUS
  Filled 2017-10-29 (×3): qty 1

## 2017-10-29 MED ORDER — DIAZEPAM 5 MG PO TABS: 5.0000 mg | ORAL_TABLET | Freq: Every day | ORAL | Status: DC | PRN

## 2017-10-29 MED ORDER — ACETAMINOPHEN 325 MG PO TABS
650.0000 mg | ORAL_TABLET | Freq: Four times a day (QID) | ORAL | Status: DC | PRN
  Administered 2017-10-30 – 2017-11-01 (×5): 650 mg via ORAL
  Filled 2017-10-29 (×5): qty 2

## 2017-10-29 MED ORDER — ONDANSETRON HCL 4 MG PO TABS: 4.0000 mg | ORAL_TABLET | Freq: Four times a day (QID) | ORAL | Status: DC | PRN

## 2017-10-29 MED ORDER — LISINOPRIL 10 MG PO TABS
10.0000 mg | ORAL_TABLET | Freq: Every day | ORAL | Status: DC
  Administered 2017-10-30 – 2017-11-01 (×2): 10 mg via ORAL
  Filled 2017-10-29 (×2): qty 1

## 2017-10-29 MED ORDER — PNEUMOCOCCAL VAC POLYVALENT 25 MCG/0.5ML IJ INJ: 0.5000 mL | INJECTION | INTRAMUSCULAR | Status: DC

## 2017-10-29 MED ORDER — ALBUTEROL SULFATE (2.5 MG/3ML) 0.083% IN NEBU: 2.5000 mg | INHALATION_SOLUTION | Freq: Four times a day (QID) | RESPIRATORY_TRACT | Status: DC | PRN

## 2017-10-29 MED ORDER — INSULIN ASPART 100 UNIT/ML ~~LOC~~ SOLN
0.0000 [IU] | Freq: Three times a day (TID) | SUBCUTANEOUS | Status: DC
  Administered 2017-10-30: 1 [IU] via SUBCUTANEOUS
  Administered 2017-10-31 (×2): 2 [IU] via SUBCUTANEOUS
  Administered 2017-11-01 (×2): 1 [IU] via SUBCUTANEOUS
  Filled 2017-10-29 (×5): qty 1

## 2017-10-29 MED ORDER — SODIUM CHLORIDE 0.9 % IV SOLN
Freq: Once | INTRAVENOUS | Status: AC
  Administered 2017-10-29: 16:00:00 via INTRAVENOUS

## 2017-10-29 MED ORDER — OXYCODONE HCL 5 MG PO TABS
5.0000 mg | ORAL_TABLET | ORAL | Status: DC | PRN
  Administered 2017-10-29 – 2017-10-31 (×4): 5 mg via ORAL
  Filled 2017-10-29 (×4): qty 1

## 2017-10-29 MED ORDER — CARBIDOPA-LEVODOPA 25-100 MG PO TABS
1.0000 | ORAL_TABLET | Freq: Three times a day (TID) | ORAL | Status: DC
  Administered 2017-10-29 – 2017-11-01 (×6): 1 via ORAL
  Filled 2017-10-29 (×11): qty 1

## 2017-10-29 NOTE — ED Provider Notes (Signed)
[facility] Emergency Department Provider Note  ____________________________________________   First MD Initiated Contact with Patient 10/29/17 1441     (approximate)  I have reviewed the triage vital signs and the nursing notes.   HISTORY  Chief Complaint Abdominal Pain    HPI Jerry Sandoval is a 52 y.o. male whose medical history includes diabetes type 2 controlled with oral medication and obesity who presents for evaluation of abdominal pain and abnormal labs and ultrasound.  He reports that during the night last night at 1:00 in the morning he had acute onset of upper abdominal pain with nausea and vomiting.  By coincidence he had an appointment with Dr. Hyacinth MeekerMiller, his primary care doctor, so he discussed the symptoms with Dr. Hyacinth MeekerMiller when he went to his appointment today.  He has had ongoing pain that is constant and severe.  He has not tried to eat or drink anything due to the pain and nausea/vomiting.  Dr. Hyacinth MeekerMiller ordered an ultrasound and lab work and prior to the results of the ultrasound but after the lab work came back he explained to the patient that he has gallbladder disease and sent him to the emergency department.  The patient denies fever/chills, chest pain, shortness of breath.  The pain is sharp, stabbing, and aching, located in the middle of his abdomen, and has been constant since 1:00 in the morning.  Nothing in particular makes it better or worse.  He has no prior diagnosis of gallbladder disease.  He does not drink alcohol more than rarely and only on social occasions.  He does not currently smoke tobacco.  Past Medical History:  Diagnosis Date  . Arteriovenous malformation of cerebral vessels   . Arthritis   . Asthma   . Carpal tunnel syndrome   . Diabetes mellitus without complication (HCC)   . Hyperlipidemia   . Hypertension   . Sleep apnea     Patient Active Problem List   Diagnosis Date Noted  . SIRS (systemic inflammatory  response syndrome) (HCC) 03/23/2016  . Bleeding hemorrhoids 10/22/2015  . Benign essential hypertension 10/22/2015  . Hyperlipidemia, mixed 04/18/2015  . Chronic gouty arthritis 04/18/2015  . DM (diabetes mellitus) type II controlled with renal manifestation (HCC) 07/16/2013  . OSA on CPAP 07/06/2013  . AVM (arteriovenous malformation) brain 07/06/2013    Past Surgical History:  Procedure Laterality Date  . CEREBRAL ANGIOGRAM    . COLONOSCOPY WITH PROPOFOL N/A 07/09/2015   Procedure: COLONOSCOPY WITH PROPOFOL;  Surgeon: Christena DeemMartin U Skulskie, MD;  [facility] END[facility]COPY;  Service: Endoscopy;  Laterality: N/A;  . EXTRACORPOREAL SHOCK WAVE LITHOTRIPSY Right 06/25/2016   Procedure: EXTRACORPOREAL SHOCK WAVE LITHOTRIPSY (ESWL);  Surgeon: Vanna ScotlandBrandon, Ashley, MD;  Location: [facility] ORS;  Service: Urology;  Laterality: Right;  . WISDOM TOOTH EXTRACTION      Prior to Admission medications   Medication Sig Start Date End Date Taking? Authorizing Provider  allopurinol (ZYLOPRIM) 300 MG tablet Take 300 mg by mouth daily.   Yes [provider]  carbidopa-levodopa (SINEMET IR) 25-100 MG tablet Take 1 tablet by mouth 3 (three) times daily. 09/02/17  Yes [provider]  cetirizine (ZYRTEC) 10 MG tablet Take 10 mg by mouth daily.   Yes [provider]  indapamide (LOZOL) 2.5 MG tablet TAKE ONE TABLET BY MOUTH EVERY DAY 10/21/17  Yes McGowan, Carollee HerterShannon A, PA-C  lisinopril (PRINIVIL,ZESTRIL) 10 MG tablet Take 10 mg by mouth daily.   Yes [provider]  metFORMIN (GLUCOPHAGE) 500  MG tablet Take 1,000 mg by mouth 2 (two) times daily with a meal.   Yes [provider]  Multiple Vitamins-Minerals (CENTRUM SILVER 50+MEN PO) Take 1 tablet by mouth daily.    Yes [provider]  albuterol (PROVENTIL HFA;VENTOLIN HFA) 108 (90 Base) MCG/ACT inhaler Inhale 2 puffs into the lungs every 6 (six) hours as needed for wheezing or shortness of breath.    [provider]    diazepam (VALIUM) 5 MG tablet Take 5 mg by mouth daily.    [provider]  glimepiride (AMARYL) 2 MG tablet Take 1 tablet by mouth daily. 10/29/17   [provider]    Allergies Patient has no known allergies.  Family History  Problem Relation Age of Onset  . Hypertension Neg Hx   . Diabetes Neg Hx   . CAD Neg Hx   . Prostate cancer Neg Hx   . Bladder Cancer Neg Hx   . Kidney cancer Neg Hx     Social History Social History   Tobacco Use  . Smoking status: Former Games developer  . Smokeless tobacco: Current User  Substance Use Topics  . Alcohol use: No  . Drug use: No    Review of Systems Constitutional: No fever/chills Eyes: No visual changes. ENT: No sore throat. Cardiovascular: Denies chest pain. Respiratory: Denies shortness of breath. Gastrointestinal: Abdominal pain with nausea and vomiting as described above Genitourinary: Negative for dysuria. Musculoskeletal: Negative for neck pain.  Negative for back pain. Integumentary: Negative for rash. Neurological: Negative for headaches, focal weakness or numbness.   ____________________________________________   PHYSICAL EXAM:  VITAL SIGNS: ED Triage Vitals  Enc Vitals Group     BP 10/29/17 1218 128/82     Pulse Rate 10/29/17 1218 94     Resp 10/29/17 1218 20     Temp 10/29/17 1218 98.6 F (37 C)     Temp Source 10/29/17 1218 Oral     SpO2 10/29/17 1218 96 %     Weight 10/29/17 1220 (!) 142.4 kg (314 lb)     Height 10/29/17 1220 1.778 m (5\' 10" )     Head Circumference --      Peak Flow --      Pain Score 10/29/17 1220 10     Pain Loc --      Pain Edu? --      Excl. in GC? --     Constitutional: Alert and oriented.  Appears very uncomfortable. Eyes: Conjunctivae are normal.  Head: Atraumatic. Nose: No congestion/rhinnorhea. Mouth/Throat: Mucous membranes are moist. Neck: No stridor.  No meningeal signs.   Cardiovascular: Normal rate, regular rhythm. Good peripheral circulation. Grossly  normal heart sounds. Respiratory: Normal respiratory effort.  No retractions. Lungs CTAB. Gastrointestinal: Obese.  Soft with severe tenderness to light palpation in the right upper quadrant and epigastrium with positive Murphy sign.  No lower abdominal tenderness.  Some guarding. Musculoskeletal: No lower extremity tenderness nor edema. No gross deformities of extremities. Neurologic:  Normal speech and language. No gross focal neurologic deficits are appreciated.  Skin:  Skin is warm, dry and intact. No rash noted. Psychiatric: Mood and affect are normal. Speech and behavior are normal.  ____________________________________________   LABS (all labs ordered are listed, but only abnormal results are displayed)  Labs Reviewed  URINALYSIS, COMPLETE (UACMP) WITH MICROSCOPIC - Abnormal; Notable for the following components:      Result Value   Color, Urine YELLOW (*)    APPearance CLEAR (*)  Hgb urine dipstick SMALL (*)    Protein, ur 30 (*)    All other components within normal limits  COMPREHENSIVE METABOLIC PANEL - Abnormal; Notable for the following components:   Glucose, Bld 107 (*)    AST 462 (*)    ALT 377 (*)    Total Bilirubin 2.6 (*)    All other components within normal limits  LIPASE, BLOOD  TROPONIN I  CBC WITH DIFFERENTIAL/PLATELET  BILIRUBIN, DIRECT   ____________________________________________  EKG  ED ECG REPORT I, Loleta Rose, the attending physician, personally viewed and interpreted this ECG.  Date: 10/29/2017 EKG Time: 12: 27 Rate: 95 Rhythm: normal sinus rhythm QRS Axis: normal Intervals: normal ST/T Wave abnormalities: normal Narrative Interpretation: no evidence of acute ischemia  ____________________________________________  RADIOLOGY   ED MD interpretation: Cholelithiasis and sludge but no evidence of acute cholecystitis.  There is mild dilatation of the distal common bile duct as well.  Official radiology report(s): US Abdomen Limited  Ruq  Result Date: 10/29/2017 CLINICAL DATA:  Upper abdominal pain EXAM: ULTRASOUND ABDOMEN LIMITED RIGHT UPPER QUADRANT COMPARISON:  CT abdomen and pelvis May 11, 2016 FINDINGS: Gallbladder: Within the gallbladder, there are echogenic foci which move and shadow consistent with cholelithiasis. Largest gallstone measures 1 cm in length. There is intermittent sludge in the gallbladder as well. There is no appreciable gallbladder wall thickening or pericholecystic fluid. No sonographic Murphy sign noted by sonographer. Common bile duct: Diameter: 8 mm, mildly dilated. No biliary duct mass or calculus is appreciable on this study. Liver: No focal lesion identified. Liver echogenicity is increased diffusely. Portal vein is patent on color Doppler imaging with normal direction of blood flow towards the liver. IMPRESSION: 1. Cholelithiasis and sludge within the gallbladder. No appreciable gallbladder wall thickening evident. No pericholecystic fluid. 2. Mild dilatation of the distal common bile duct without mass or calculus appreciable by ultrasound. If further evaluation of the biliary ductal system is felt to be warranted, MRCP from an imaging standpoint would be the optimum study of choice to further evaluate. 3. Diffuse increase in liver echogenicity, a finding felt to be indicative of hepatic steatosis. While no focal liver lesions are evident on this study, it must be cautioned that the sensitivity of ultrasound for detection of focal liver lesions is diminished significantly in this circumstance. Electronically Signed   By: Bretta Bang III M.D.   On: 10/29/2017 11:51    ____________________________________________   PROCEDURES  Critical Care performed: No   Procedure(s) performed:   Procedures   ____________________________________________   INITIAL IMPRESSION / ASSESSMENT AND PLAN / ED COURSE  As part of my medical decision making, I reviewed the following data within the electronic  MEDICAL RECORD NUMBER Nursing notes reviewed and incorporated, Labs reviewed , EKG interpreted , Old chart reviewed, Discussed with admitting physician  and A consult was requested and obtained from this/these consultant(s) Gastroenterology    Differential diagnosis includes, but is not limited to, choledocholithiasis, cholecystitis, cholangitis, atypical presentation of ACS.  The patient is having no chest pain or shortness of breath, has elevated AST and ALT and a T bili of greater than 2.  His symptoms are very consistent with choledocholithiasis given that the ultrasound is reassuring for no cholecystitis.  Vital signs otherwise stable.  I am administering morphine 4 mill grams IV, Zofran 4 mg IV, and a liter of fluids at a rate of 200 mm's per hour since he is remaining n.p.o.  I will call the gastroenterologist on call and  discussed the case with her and then plan on admission to the hospital.  Clinical Course as of Oct 30 1542  Fri Oct 29, 2017  1533 Spoke with Dr. Allegra Lai who requested direct bilirubin add-on.  She will speak with Dr. Servando Snare who will decide whether or not to get an MRCP or proceed with ERCP.  Asked me to talk to hospitalist which I will do now.   [CF]  1610 Spoke with Dr. Allena Katz with the hospitalist service who will admit.   [CF]    Clinical Course User Index [CF] Loleta Rose, MD    ____________________________________________  FINAL CLINICAL IMPRESSION(S) / ED DIAGNOSES  Final diagnoses:  Choledocholithiasis  Elevated bilirubin  Elevated LFTs  RUQ pain     MEDICATIONS GIVEN DURING THIS VISIT:  Medications  morphine 4 MG/ML injection 4 mg (4 mg Intravenous Given 10/29/17 1535)  ondansetron (ZOFRAN) injection 4 mg (4 mg Intravenous Given 10/29/17 1534)  0.9 %  sodium chloride infusion ( Intravenous New Bag/Given 10/29/17 1533)     ED Discharge Orders    None       Note:  This document was prepared using Dragon voice recognition software and may include  unintentional dictation errors.    Loleta Rose, MD 10/29/17 1544

## 2017-10-29 NOTE — Progress Notes (Signed)
   10/29/17 1745  Clinical Encounter Type  Visited With Patient  Visit Type Initial   Provided Advance Directive education to patient per order requisition request.  Chaplain answered his questions and patient verbalized understanding.  Copy of Advance Directive left with patient.

## 2017-10-29 NOTE — ED Triage Notes (Signed)
Epigastric pain began 1 am, states come from MD office and has had blood drawn and ultrasound done.

## 2017-10-29 NOTE — Consult Note (Signed)
Cephas Darby, MD [address]  [place]  [place], [place] [address]  Main: [phone]  Fax: [phone] Pager: [phone]   Consultation  Referring Provider:     No ref. provider found Primary Care Physician:  Rusty Aus, MD Primary Gastroenterologist: none         Reason for Consultation:     Possible choledocholithiasis  Date of Admission:  10/29/2017 Date of Consultation:  10/29/2017         HPI:   Jerry Sandoval is a 52 y.o. male with history of metabolic syndrome who presents with acute onset of right upper quadrant pain associated with nausea that started this morning. He was seen by PCP who ordered an ultrasound as well as LFTs and there was concern for cholelithiasis and possible choledocholithiasis. Therefore he was referred to the ER for admission. He was found to have AST 462, ALT 377, total bilirubin 2.6, direct bilirubin 1.2, alkaline phosphatase 72. Lipase, CBC was normal. Ultrasound revealed CBD 8 mm, mildly dilated and no evidence of bile duct stones. He does have fatty liver  Patient reports that he has been working on changing his lifestyle. However, for the last few months, his wife has been battling with cancer and has been going through tremendous amount of stress. Eating out most of the time. He denies alcohol use  NSAIDs: none  Antiplts/Anticoagulants/Anti thrombotics: none  GI Procedures: none He denies family history of GI malignancy  Past Medical History:  Diagnosis Date  . Arteriovenous malformation of cerebral vessels   . Arthritis   . Asthma   . Carpal tunnel syndrome   . Diabetes mellitus without complication ([place])   . Hyperlipidemia   . Hypertension   . Sleep apnea     Past Surgical History:  Procedure Laterality Date  . CEREBRAL ANGIOGRAM    . COLONOSCOPY WITH PROPOFOL N/A 07/09/2015   Procedure: COLONOSCOPY WITH PROPOFOL;  Surgeon: Lollie Sails, MD;  Location[facility] ENDOSCOPY;  Service: Endoscopy;   Laterality: N/A;  . EXTRACORPOREAL SHOCK WAVE LITHOTRIPSY Right 06/25/2016   Procedure: EXTRACORPOREAL SHOCK WAVE LITHOTRIPSY (ESWL);  Surgeon: Hollice Espy, MD;  Location: [facility] ORS;  Service: Urology;  Laterality: Right;  . WISDOM TOOTH EXTRACTION      Prior to Admission medications   Medication Sig Start Date End Date Taking? Authorizing Provider  allopurinol (ZYLOPRIM) 300 MG tablet Take 300 mg by mouth daily.   Yes [provider]  carbidopa-levodopa (SINEMET IR) 25-100 MG tablet Take 1 tablet by mouth 3 (three) times daily. 09/02/17  Yes [provider]  cetirizine (ZYRTEC) 10 MG tablet Take 10 mg by mouth daily.   Yes [provider]  indapamide (LOZOL) 2.5 MG tablet TAKE ONE TABLET BY MOUTH EVERY DAY 10/21/17  Yes McGowan, Larene Beach A, PA-C  lisinopril (PRINIVIL,ZESTRIL) 10 MG tablet Take 10 mg by mouth daily.   Yes [provider]  metFORMIN (GLUCOPHAGE) 500 MG tablet Take 1,000 mg by mouth 2 (two) times daily with a meal.   Yes [provider]  Multiple Vitamins-Minerals (CENTRUM SILVER 50+MEN PO) Take 1 tablet by mouth daily.    Yes [provider]  albuterol (PROVENTIL HFA;VENTOLIN HFA) 108 (90 Base) MCG/ACT inhaler Inhale 2 puffs into the lungs every 6 (six) hours as needed for wheezing or shortness of breath.    [provider]  diazepam (VALIUM) 5 MG tablet Take 5 mg by mouth daily.    [provider]  glimepiride (  AMARYL) 2 MG tablet Take 1 tablet by mouth daily. 10/29/17   [provider]    Family History  Problem Relation Age of Onset  . Diabetes Father   . Hypertension Neg Hx   . CAD Neg Hx   . Prostate cancer Neg Hx   . Bladder Cancer Neg Hx   . Kidney cancer Neg Hx      Social History   Tobacco Use  . Smoking status: Former Research scientist (life sciences)  . Smokeless tobacco: Current User  Substance Use Topics  . Alcohol use: No  . Drug use: No    Allergies as of 10/29/2017  . (No Known Allergies)     Review of Systems:    All systems reviewed and negative except where noted in HPI.   Physical Exam:  Vital signs in last 24 hours: Temp:  [98.6 F (37 C)] 98.6 F (37 C) (09/13 1218) Pulse Rate:  [87-94] 87 (09/13 1539) Resp:  [16-20] 16 (09/13 1539) BP: (122-128)/(80-82) 122/80 (09/13 1539) SpO2:  [96 %] 96 % (09/13 1539) Weight:  [142.4 kg] 142.4 kg (09/13 1220)   General:   Pleasant, cooperative in NAD Head:  Normocephalic and atraumatic. Eyes:   No icterus.   Conjunctiva pink. PERRLA. Ears:  Normal auditory acuity. Neck:  Supple; no masses or thyroidomegaly Lungs: Respirations even and unlabored. Lungs clear to auscultation bilaterally.   No wheezes, crackles, or rhonchi.  Heart:  Regular rate and rhythm;  Without murmur, clicks, rubs or gallops Abdomen:  Soft, nondistended, right upper quadrant tenderness. Normal bowel sounds. Obese, limited abdominal exam,  No rebound or guarding.  Rectal:  Not performed. Msk:  Symmetrical without gross deformities.  Strength normal  Extremities:  Without edema, cyanosis or clubbing. Neurologic:  Alert and oriented x3;  grossly normal neurologically. Skin:  Intact without significant lesions or rashes. Cervical Nodes:  No significant cervical adenopathy. Psych:  Alert and cooperative. Normal affect.  LAB RESULTS: CBC Latest Ref Rng & Units 10/29/2017 03/24/2016 03/23/2016  WBC 3.8 - 10.6 K/uL 8.2 6.9 9.9  Hemoglobin 13.0 - 18.0 g/dL 16.0 13.6 13.3  Hematocrit 40.0 - 52.0 % 46.2 40.2 38.3(L)  Platelets 150 - 440 K/uL 240 224 219    BMET BMP Latest Ref Rng & Units 10/29/2017 10/23/2016 03/24/2016  Glucose 70 - 99 mg/dL 107(H) 102(H) 103(H)  BUN 6 - 20 mg/dL _0 Creatinine 0.61 - 1.24 mg/dL 1.03 0.98 1.08  BUN/Creat Ratio 9 - 20 - 15 -  Sodium 135 - 145 mmol/L 137 140 139  Potassium 3.5 - 5.1 mmol/L 3.8 5.1 3.7  Chloride 98 - 111 mmol/L 100 98 104  CO2 22 - 32 mmol/L _1 Calcium 8.9 - 10.3 mg/dL 10.3 10.3(H) 9.2     LFT Hepatic Function Latest Ref Rng & Units 10/29/2017 03/22/2016  Total Protein 6.5 - 8.1 g/dL 8.1 8.2(H)  Albumin 3.5 - 5.0 g/dL 4.7 4.8  AST 15 - 41 U/L 462(H) 25  ALT 0 - 44 U/L 377(H) 25  Alk Phosphatase 38 - 126 U/L 72 50  Total Bilirubin 0.3 - 1.2 mg/dL 2.6(H) 0.9  Bilirubin, Direct 0.0 - 0.2 mg/dL 1.2(H) -     STUDIES: US Abdomen Limited Ruq  Result Date: 10/29/2017 CLINICAL DATA:  Upper abdominal pain EXAM: ULTRASOUND ABDOMEN LIMITED RIGHT UPPER QUADRANT COMPARISON:  CT abdomen and pelvis May 11, 2016 FINDINGS: Gallbladder: Within the gallbladder, there are echogenic foci which move and shadow consistent with cholelithiasis. Largest gallstone  measures 1 cm in length. There is intermittent sludge in the gallbladder as well. There is no appreciable gallbladder wall thickening or pericholecystic fluid. No sonographic Murphy sign noted by sonographer. Common bile duct: Diameter: 8 mm, mildly dilated. No biliary duct mass or calculus is appreciable on this study. Liver: No focal lesion identified. Liver echogenicity is increased diffusely. Portal vein is patent on color Doppler imaging with normal direction of blood flow towards the liver. IMPRESSION: 1. Cholelithiasis and sludge within the gallbladder. No appreciable gallbladder wall thickening evident. No pericholecystic fluid. 2. Mild dilatation of the distal common bile duct without mass or calculus appreciable by ultrasound. If further evaluation of the biliary ductal system is felt to be warranted, MRCP from an imaging standpoint would be the optimum study of choice to further evaluate. 3. Diffuse increase in liver echogenicity, a finding felt to be indicative of hepatic steatosis. While no focal liver lesions are evident on this study, it must be cautioned that the sensitivity of ultrasound for detection of focal liver lesions is diminished significantly in this circumstance. Electronically Signed   By: Lowella Grip III M.D.   On:  10/29/2017 11:51      Impression / Plan:   Jerry Sandoval is a 52 y.o. Caucasian male with metabolic syndrome admitted with biliary colic, symptomatic cholelithiasis and possible choledocholithiasis. CBD is dilated to 8 mm with no evidence of calculus based on ultrasound. Alkaline phosphatase is normal, D Bili/TBili is 1.2/2.6. He has intermediate probability for choledocholithiasis. He does not have gallstone pancreatitis or cholangitis.  Recommend MRCP to evaluate for choledocholithiasis Monitor LFTs Nothing by mouth IV fluids, pain medication No urgent indication for ERCP unless MRCP confirms choledocholithiasis or he has worsening LFTs with obstructive pattern He will need cholecystectomy Discussed case with Dr. Allen Norris Thank you for involving me in the care of this patient.  Dr. Allen Norris to cover for the weekend    LOS: 0 days   Sherri Sear, MD  10/29/2017, 4:35 PM   Note: This dictation was prepared with Dragon dictation along with smaller phrase technology. Any transcriptional errors that result from this process are unintentional.

## 2017-10-29 NOTE — H&P (Signed)
[facility] [place] [facility]   PATIENT NAME: Jerry Sandoval    MR#:  [id number]  DATE OF BIRTH:  [birth year]  DATE OF ADMISSION:  10/29/2017  PRIMARY CARE PHYSICIAN: Danella Penton, MD   REQUESTING/REFERRING PHYSICIAN: Corliss Parish MD  CHIEF COMPLAINT:   Chief Complaint  Patient presents with  . Abdominal Pain    HISTORY OF PRESENT ILLNESS: Jerry Sandoval  is a 52 y.o. male with a known history of diabetes type 2, hypertension, hyperlipidemia and sleep apnea was presented with abdominal pain that started since this morning.  He describes the pain in the right upper quadrant.  Radiating to his back.  He was seen by his primary care provider who ordered a ultrasound of the abdomen and there was concern for quality of cholelithiasis he was referred to ED for admission.  Patient also noticed to have elevated liver function test consistent with quality of cholelithiasis.  He states that he has not had any fevers chills.  He describes the pain as sharp and constant.  No nausea or vomiting.  PAST MEDICAL HISTORY:   Past Medical History:  Diagnosis Date  . Arteriovenous malformation of cerebral vessels   . Arthritis   . Asthma   . Carpal tunnel syndrome   . Diabetes mellitus without complication (HCC)   . Hyperlipidemia   . Hypertension   . Sleep apnea     PAST SURGICAL HISTORY:  Past Surgical History:  Procedure Laterality Date  . CEREBRAL ANGIOGRAM    . COLONOSCOPY WITH PROPOFOL N/A 07/09/2015   Procedure: COLONOSCOPY WITH PROPOFOL;  Surgeon: Christena Deem, MD;  Location: [facility] ENDOSCOPY;  Service: Endoscopy;  Laterality: N/A;  . EXTRACORPOREAL SHOCK WAVE LITHOTRIPSY Right 06/25/2016   Procedure: EXTRACORPOREAL SHOCK WAVE LITHOTRIPSY (ESWL);  Surgeon: Vanna Scotland, MD;  Location: [facility] ORS;  Service: Urology;  Laterality: Right;  . WISDOM TOOTH EXTRACTION      SOCIAL HISTORY:  Social History   Tobacco Use  . Smoking status: Former Games developer  .  Smokeless tobacco: Current User  Substance Use Topics  . Alcohol use: No    FAMILY HISTORY:  Family History  Problem Relation Age of Onset  . Diabetes Father   . Hypertension Neg Hx   . CAD Neg Hx   . Prostate cancer Neg Hx   . Bladder Cancer Neg Hx   . Kidney cancer Neg Hx     DRUG ALLERGIES: No Known Allergies  REVIEW OF SYSTEMS:   CONSTITUTIONAL: No fever, fatigue or weakness.  EYES: No blurred or double vision.  EARS, NOSE, AND THROAT: No tinnitus or ear pain.  RESPIRATORY: No cough, shortness of breath, wheezing or hemoptysis.  CARDIOVASCULAR: No chest pain, orthopnea, edema.  GASTROINTESTINAL: No nausea, vomiting, diarrhea or positive abdominal pain.  GENITOURINARY: No dysuria, hematuria.  ENDOCRINE: No polyuria, nocturia,  HEMATOLOGY: No anemia, easy bruising or bleeding SKIN: No rash or lesion. MUSCULOSKELETAL: No joint pain or arthritis.   NEUROLOGIC: No tingling, numbness, weakness.  PSYCHIATRY: No anxiety or depression.   MEDICATIONS AT HOME:  Prior to Admission medications   Medication Sig Start Date End Date Taking? Authorizing Provider  allopurinol (ZYLOPRIM) 300 MG tablet Take 300 mg by mouth daily.   Yes [provider]  carbidopa-levodopa (SINEMET IR) 25-100 MG tablet Take 1 tablet by mouth 3 (three) times daily. 09/02/17  Yes [provider]  cetirizine (ZYRTEC) 10 MG tablet Take 10 mg by mouth daily.   Yes [provider]  indapamide (LOZOL) 2.5 MG tablet TAKE ONE TABLET BY MOUTH EVERY DAY 10/21/17  Yes McGowan, Carollee HerterShannon A, PA-C  lisinopril (PRINIVIL,ZESTRIL) 10 MG tablet Take 10 mg by mouth daily.   Yes [provider]  metFORMIN (GLUCOPHAGE) 500 MG tablet Take 1,000 mg by mouth 2 (two) times daily with a meal.   Yes [provider]  Multiple Vitamins-Minerals (CENTRUM SILVER 50+MEN PO) Take 1 tablet by mouth daily.    Yes [provider]  albuterol (PROVENTIL HFA;VENTOLIN HFA) 108 (90 Base) MCG/ACT  inhaler Inhale 2 puffs into the lungs every 6 (six) hours as needed for wheezing or shortness of breath.    [provider]  diazepam (VALIUM) 5 MG tablet Take 5 mg by mouth daily.    [provider]  glimepiride (AMARYL) 2 MG tablet Take 1 tablet by mouth daily. 10/29/17   [provider]      PHYSICAL EXAMINATION:   VITAL SIGNS: Blood pressure 122/80, pulse 87, temperature 98.6 F (37 C), temperature source Oral, resp. rate 16, height 5\' 10"  (1.778 m), weight (!) 142.4 kg, SpO2 96 %.  GENERAL:  52 y.o.-year-old patient lying in the bed with no acute distress.  EYES: Pupils equal, round, reactive to light and accommodation. No scleral icterus. Extraocular muscles intact.  HEENT: Head atraumatic, normocephalic. Oropharynx and nasopharynx clear.  NECK:  Supple, no jugular venous distention. No thyroid enlargement, no tenderness.  LUNGS: Normal breath sounds bilaterally, no wheezing, rales,rhonchi or crepitation. No use of accessory muscles of respiration.  CARDIOVASCULAR: S1, S2 normal. No murmurs, rubs, or gallops.  ABDOMEN: Soft, mild right upper quadrant tenderness, nondistended. Bowel sounds present. No organomegaly or mass.  EXTREMITIES: No pedal edema, cyanosis, or clubbing.  NEUROLOGIC: Cranial nerves II through XII are intact. Muscle strength 5/5 in all extremities. Sensation intact. Gait not checked.  PSYCHIATRIC: The patient is alert and oriented x 3.  SKIN: No obvious rash, lesion, or ulcer.   LABORATORY PANEL:   CBC Recent Labs  Lab 10/29/17 1232  WBC 8.2  HGB 16.0  HCT 46.2  PLT 240  MCV 92.4  MCH 32.0  MCHC 34.6  RDW 14.0  LYMPHSABS 1.6  MONOABS 0.7  EOSABS 0.1  BASOSABS 0.0   ------------------------------------------------------------------------------------------------------------------  Chemistries  Recent Labs  Lab 10/29/17 1232  NA 137  K 3.8  CL 100  CO2 27  GLUCOSE 107*  BUN 12  CREATININE 1.03  CALCIUM 10.3  AST  462*  ALT 377*  ALKPHOS 72  BILITOT 2.6*   ------------------------------------------------------------------------------------------------------------------ estimated creatinine clearance is 123.7 mL/min (by C-G formula based on SCr of 1.03 mg/dL). ------------------------------------------------------------------------------------------------------------------ No results for input(s): TSH, T4TOTAL, T3FREE, THYROIDAB in the last 72 hours.  Invalid input(s): FREET3   Coagulation profile No results for input(s): INR, PROTIME in the last 168 hours. ------------------------------------------------------------------------------------------------------------------- No results for input(s): DDIMER in the last 72 hours. -------------------------------------------------------------------------------------------------------------------  Cardiac Enzymes Recent Labs  Lab 10/29/17 1232  TROPONINI <0.03   ------------------------------------------------------------------------------------------------------------------ Invalid input(s): POCBNP  ---------------------------------------------------------------------------------------------------------------  Urinalysis    Component Value Date/Time   COLORURINE YELLOW (A) 10/29/2017 1232   APPEARANCEUR CLEAR (A) 10/29/2017 1232   APPEARANCEUR Clear 10/16/2016 0834   LABSPEC 1.012 10/29/2017 1232   PHURINE 6.0 10/29/2017 1232   GLUCOSEU NEGATIVE 10/29/2017 1232   HGBUR SMALL (A) 10/29/2017 1232   BILIRUBINUR NEGATIVE 10/29/2017 1232   BILIRUBINUR Negative 10/16/2016 0834   KETONESUR NEGATIVE 10/29/2017 1232   PROTEINUR 30 (A) 10/29/2017 1232   NITRITE NEGATIVE 10/29/2017 1232  LEUKOCYTESUR NEGATIVE 10/29/2017 1232   LEUKOCYTESUR Negative 10/16/2016 0834     RADIOLOGY: US Abdomen Limited Ruq  Result Date: 10/29/2017 CLINICAL DATA:  Upper abdominal pain EXAM: ULTRASOUND ABDOMEN LIMITED RIGHT UPPER QUADRANT COMPARISON:  CT abdomen and  pelvis May 11, 2016 FINDINGS: Gallbladder: Within the gallbladder, there are echogenic foci which move and shadow consistent with cholelithiasis. Largest gallstone measures 1 cm in length. There is intermittent sludge in the gallbladder as well. There is no appreciable gallbladder wall thickening or pericholecystic fluid. No sonographic Murphy sign noted by sonographer. Common bile duct: Diameter: 8 mm, mildly dilated. No biliary duct mass or calculus is appreciable on this study. Liver: No focal lesion identified. Liver echogenicity is increased diffusely. Portal vein is patent on color Doppler imaging with normal direction of blood flow towards the liver. IMPRESSION: 1. Cholelithiasis and sludge within the gallbladder. No appreciable gallbladder wall thickening evident. No pericholecystic fluid. 2. Mild dilatation of the distal common bile duct without mass or calculus appreciable by ultrasound. If further evaluation of the biliary ductal system is felt to be warranted, MRCP from an imaging standpoint would be the optimum study of choice to further evaluate. 3. Diffuse increase in liver echogenicity, a finding felt to be indicative of hepatic steatosis. While no focal liver lesions are evident on this study, it must be cautioned that the sensitivity of ultrasound for detection of focal liver lesions is diminished significantly in this circumstance. Electronically Signed   By: Bretta Bang III M.D.   On: 10/29/2017 11:51    EKG: Orders placed or performed during the hospital encounter of 10/29/17  . ED EKG  . ED EKG    IMPRESSION AND PLAN: Patient is 52 year old with abdominal pain  1.  Acute  choldeo cholelithiasis ED physician has spoken to the GI physician patient will need ERCP Clear liquid diet for now n.p.o. past midnight  2.  Diabetes type 2 we will place on sliding scale hold his metformin Place on sliding scale insulin  3.  Essential hypertension continue hydralazine  4.  Sleep  apnea placed on CPAP  5.  DVT prophylaxis with SCDs    All the records are reviewed and case discussed with ED provider. Management plans discussed with the patient, family and they are in agreement.  CODE STATUS: Code Status History    Date Active Date Inactive Code Status Order ID Comments User Context   03/23/2016 0446 03/25/2016 1321 Full Code [id number]  Ihor Austin, MD Inpatient       TOTAL TIME TAKING CARE OF THIS PATIENT:55 minutes.    Auburn Bilberry M.D on 10/29/2017 at 4:07 PM  Between 7am to 6pm - Pager - [phone]  After 6pm go to [email or web address] - password [email or web address]  [facility] Office  [phone]  CC: Primary care physician; Danella Penton, MD

## 2017-10-30 ENCOUNTER — Encounter: Payer: Self-pay | Admitting: Anesthesiology

## 2017-10-30 DIAGNOSIS — R7989 Other specified abnormal findings of blood chemistry: Secondary | ICD-10-CM

## 2017-10-30 DIAGNOSIS — R945 Abnormal results of liver function studies: Secondary | ICD-10-CM

## 2017-10-30 DIAGNOSIS — K8020 Calculus of gallbladder without cholecystitis without obstruction: Secondary | ICD-10-CM

## 2017-10-30 DIAGNOSIS — K8000 Calculus of gallbladder with acute cholecystitis without obstruction: Secondary | ICD-10-CM

## 2017-10-30 DIAGNOSIS — K8050 Calculus of bile duct without cholangitis or cholecystitis without obstruction: Secondary | ICD-10-CM

## 2017-10-30 LAB — COMPREHENSIVE METABOLIC PANEL
ALT: 37 U/L (ref 0–44)
AST: 193 U/L — ABNORMAL HIGH (ref 15–41)
Albumin: 4.2 g/dL (ref 3.5–5.0)
Alkaline Phosphatase: 73 U/L (ref 38–126)
Anion gap: 7 (ref 5–15)
BUN: 10 mg/dL (ref 6–20)
CO2: 28 mmol/L (ref 22–32)
Calcium: 9.2 mg/dL (ref 8.9–10.3)
Chloride: 101 mmol/L (ref 98–111)
Creatinine, Ser: 1.02 mg/dL (ref 0.61–1.24)
GFR calc Af Amer: >60 mL/min (ref >60)
GFR calc non Af Amer: >60 mL/min (ref >60)
Glucose, Bld: 135 mg/dL — ABNORMAL HIGH (ref 70–99)
Potassium: 3.6 mmol/L (ref 3.5–5.1)
Sodium: 136 mmol/L (ref 135–145)
Total Bilirubin: 1.9 mg/dL — ABNORMAL HIGH (ref 0.3–1.2)
Total Protein: 7 g/dL (ref 6.5–8.1)

## 2017-10-30 LAB — CBC
HCT: 42.9 % (ref 40.0–52.0)
Hemoglobin: 15 g/dL (ref 13.0–18.0)
MCH: 32.3 pg (ref 26.0–34.0)
MCHC: 34.8 g/dL (ref 32.0–36.0)
MCV: 92.6 fL (ref 80.0–100.0)
Platelets: 201 10*3/uL (ref 150–440)
RBC: 4.63 MIL/uL (ref 4.40–5.90)
RDW: 14 % (ref 11.5–14.5)
WBC: 7.3 10*3/uL (ref 3.8–10.6)

## 2017-10-30 MED ORDER — SODIUM CHLORIDE 0.9 % IV SOLN
3.0000 g | Freq: Four times a day (QID) | INTRAVENOUS | Status: DC
  Administered 2017-10-30 – 2017-11-01 (×8): 3 g via INTRAVENOUS
  Filled 2017-10-30 (×10): qty 3

## 2017-10-30 NOTE — Consult Note (Signed)
Surgical Consultation  10/30/2017  Jerry Sandoval is an 52 y.o. male.   Referring Physician: Salary  CC: Right upper quadrant pain  HPI: This patient with an episode of right upper quadrant pain and epigastric pain that radiated through to his back.  A work-up suggested the potential for choledocholithiasis but with MR etc. his findings were not suspicious for recurrent intraluminal stone disease.  He does have gallstones.  His LFTs have almost returned to normal.  He denies fevers or chills no nausea or vomiting at this time.  He states he had an episode like this 6 years ago that did not last as long.  He also has a small umbilical hernia.  He also has a rectus diastases that was pointed out to me by him.  Past Medical History:  Diagnosis Date  . Arteriovenous malformation of cerebral vessels   . Arthritis   . Asthma   . Carpal tunnel syndrome   . Diabetes mellitus without complication ([place])   . Hyperlipidemia   . Hypertension   . Sleep apnea     Past Surgical History:  Procedure Laterality Date  . CEREBRAL ANGIOGRAM    . COLONOSCOPY WITH PROPOFOL N/A 07/09/2015   Procedure: COLONOSCOPY WITH PROPOFOL;  Surgeon: Lollie Sails, MD;  Location: [facility] ENDOSCOPY;  Service: Endoscopy;  Laterality: N/A;  . EXTRACORPOREAL SHOCK WAVE LITHOTRIPSY Right 06/25/2016   Procedure: EXTRACORPOREAL SHOCK WAVE LITHOTRIPSY (ESWL);  Surgeon: Hollice Espy, MD;  Location: [facility] ORS;  Service: Urology;  Laterality: Right;  . WISDOM TOOTH EXTRACTION      Family History  Problem Relation Age of Onset  . Diabetes Father   . Hypertension Neg Hx   . CAD Neg Hx   . Prostate cancer Neg Hx   . Bladder Cancer Neg Hx   . Kidney cancer Neg Hx     Social History:  reports that he has quit smoking. His smokeless tobacco use includes chew. He reports that he does not drink alcohol or use drugs.  He works at [employer] as a Librarian, academic.  He does not do much heavy lifting.  Allergies: No Known  Allergies  Medications reviewed.   Review of Systems:   Review of Systems  Constitutional: Negative.   HENT: Negative.   Eyes: Negative.   Respiratory: Negative.   Cardiovascular: Negative.   Gastrointestinal: Positive for abdominal pain. Negative for constipation, diarrhea, heartburn, nausea and vomiting.  Genitourinary: Negative.   Musculoskeletal: Negative.   Skin: Negative.   Neurological: Negative.   Endo/Heme/Allergies: Negative.   Psychiatric/Behavioral: Negative.      Physical Exam:  BP 122/76   Pulse 82   Temp 98.4 F (36.9 C) (Oral)   Resp 16   Ht _0  (1.778 m)   Wt (!) 142.4 kg   SpO2 95%   BMI 45.05 kg/m   Physical Exam  Constitutional: He is oriented to person, place, and time. He appears well-developed.  Morbidly obese no acute distress  HENT:  Head: Normocephalic and atraumatic.  Eyes: Pupils are equal, round, and reactive to light. EOM are normal. Scleral icterus is present.  Minimal scleral icterus minimal jaundice  Cardiovascular: Normal rate and regular rhythm.  Pulmonary/Chest: Effort normal and breath sounds normal.  Abdominal: There is tenderness in the right upper quadrant. There is no rebound and no guarding. A hernia is present.  Obese and protuberant abdomen small umbilical hernia which is reducible and nontender minimal right upper quadrant tenderness  Neurological: He is alert and oriented  to person, place, and time.  Skin: Skin is warm and dry. He is not diaphoretic.  Vitals reviewed.     Results for orders placed or performed during the hospital encounter of 10/29/17 (from the past 48 hour(s))  Lipase, blood     Status: None   Collection Time: 10/29/17 12:32 PM  Result Value Ref Range   Lipase 43 11 - 51 U/L    Comment: Performed at [facility], [address] [place], [place] [address]  Urinalysis, Complete w Microscopic     Status: Abnormal   Collection Time: 10/29/17 12:32 PM  Result Value Ref Range    Color, Urine YELLOW (A) YELLOW   APPearance CLEAR (A) CLEAR   Specific Gravity, Urine 1.012 1.005 - 1.030   pH 6.0 5.0 - 8.0   Glucose, UA NEGATIVE NEGATIVE mg/dL   Hgb urine dipstick SMALL (A) NEGATIVE   Bilirubin Urine NEGATIVE NEGATIVE   Ketones, ur NEGATIVE NEGATIVE mg/dL   Protein, ur 30 (A) NEGATIVE mg/dL   Nitrite NEGATIVE NEGATIVE   Leukocytes, UA NEGATIVE NEGATIVE   RBC / HPF 0-5 0 - 5 RBC/hpf   WBC, UA NONE SEEN 0 - 5 WBC/hpf   Bacteria, UA NONE SEEN NONE SEEN   Squamous Epithelial / LPF NONE SEEN 0 - 5   Mucus PRESENT     Comment: Performed at [facility], [address] [place], [place] [address]  Troponin I     Status: None   Collection Time: 10/29/17 12:32 PM  Result Value Ref Range   Troponin I <0.03 <0.03 ng/mL    Comment: Performed at [facility], [address] [place], [place] [address]  CBC with Differential/Platelet     Status: None   Collection Time: 10/29/17 12:32 PM  Result Value Ref Range   WBC 8.2 3.8 - 10.6 K/uL   RBC 5.00 4.40 - 5.90 MIL/uL   Hemoglobin 16.0 13.0 - 18.0 g/dL   HCT 46.2 40.0 - 52.0 %   MCV 92.4 80.0 - 100.0 fL   MCH 32.0 26.0 - 34.0 pg   MCHC 34.6 32.0 - 36.0 g/dL   RDW 14.0 11.5 - 14.5 %   Platelets 240 150 - 440 K/uL   Neutrophils Relative % 69 %   Neutro Abs 5.7 1.4 - 6.5 K/uL   Lymphocytes Relative 20 %   Lymphs Abs 1.6 1.0 - 3.6 K/uL   Monocytes Relative 9 %   Monocytes Absolute 0.7 0.2 - 1.0 K/uL   Eosinophils Relative 1 %   Eosinophils Absolute 0.1 0 - 0.7 K/uL   Basophils Relative 1 %   Basophils Absolute 0.0 0 - 0.1 K/uL    Comment: Performed at [facility], [address] [place], [place] [address]  Comprehensive metabolic panel     Status: Abnormal   Collection Time: 10/29/17 12:32 PM  Result Value Ref Range   Sodium 137 135 - 145 mmol/L   Potassium 3.8 3.5 - 5.1 mmol/L   Chloride 100 98 - 111 mmol/L   CO2 27 22 - 32 mmol/L   Glucose, Bld 107 (H) 70 - 99 mg/dL   BUN 12 6 -  20 mg/dL   Creatinine, Ser 1.03 0.61 - 1.24 mg/dL   Calcium 10.3 8.9 - 10.3 mg/dL   Total Protein 8.1 6.5 - 8.1 g/dL   Albumin 4.7 3.5 - 5.0 g/dL   AST 462 (H) 15 - 41 U/L   ALT 377 (H) 0 - 44 U/L  Alkaline Phosphatase 72 38 - 126 U/L   Total Bilirubin 2.6 (H) 0.3 - 1.2 mg/dL   GFR calc non Af Amer >60 >60 mL/min   GFR calc Af Amer >60 >60 mL/min    Comment: (NOTE) The eGFR has been calculated using the CKD EPI equation. This calculation has not been validated in all clinical situations. eGFR's persistently <60 mL/min signify possible Chronic Kidney Disease.    Anion gap 10 5 - 15    Comment: Performed at [facility], [address] [place], [place] [address]  Bilirubin, direct     Status: Abnormal   Collection Time: 10/29/17  3:40 PM  Result Value Ref Range   Bilirubin, Direct 1.2 (H) 0.0 - 0.2 mg/dL    Comment: Performed at [facility], [address] [place], [place] [address]  Glucose, capillary     Status: Abnormal   Collection Time: 10/29/17  5:35 PM  Result Value Ref Range   Glucose-Capillary 108 (H) 70 - 99 mg/dL   Comment 1 Notify RN   Glucose, capillary     Status: Abnormal   Collection Time: 10/29/17 10:14 PM  Result Value Ref Range   Glucose-Capillary 115 (H) 70 - 99 mg/dL  CBC     Status: None   Collection Time: 10/30/17  5:18 AM  Result Value Ref Range   WBC 7.3 3.8 - 10.6 K/uL   RBC 4.63 4.40 - 5.90 MIL/uL   Hemoglobin 15.0 13.0 - 18.0 g/dL   HCT 42.9 40.0 - 52.0 %   MCV 92.6 80.0 - 100.0 fL   MCH 32.3 26.0 - 34.0 pg   MCHC 34.8 32.0 - 36.0 g/dL   RDW 14.0 11.5 - 14.5 %   Platelets 201 150 - 440 K/uL    Comment: Performed at [facility], [address] [place], [place] [address]  Comprehensive metabolic panel     Status: Abnormal   Collection Time: 10/30/17  5:18 AM  Result Value Ref Range   Sodium 136 135 - 145 mmol/L   Potassium 3.6 3.5 - 5.1 mmol/L   Chloride 101 98 - 111 mmol/L   CO2 28 22 - 32 mmol/L    Glucose, Bld 135 (H) 70 - 99 mg/dL   BUN 10 6 - 20 mg/dL   Creatinine, Ser 1.02 0.61 - 1.24 mg/dL   Calcium 9.2 8.9 - 10.3 mg/dL   Total Protein 7.0 6.5 - 8.1 g/dL   Albumin 4.2 3.5 - 5.0 g/dL   AST 193 (H) 15 - 41 U/L   ALT 37 0 - 44 U/L   Alkaline Phosphatase 73 38 - 126 U/L   Total Bilirubin 1.9 (H) 0.3 - 1.2 mg/dL   GFR calc non Af Amer >60 >60 mL/min   GFR calc Af Amer >60 >60 mL/min    Comment: (NOTE) The eGFR has been calculated using the CKD EPI equation. This calculation has not been validated in all clinical situations. eGFR's persistently <60 mL/min signify possible Chronic Kidney Disease.    Anion gap 7 5 - 15    Comment: Performed at [facility], [address] [place], [place] [address]   Mr Abdomen Mrcp JS Contrast  Result Date: 10/29/2017 CLINICAL DATA:  Cholelithiasis. Mild common duct dilatation on ultrasound. EXAM: MRI ABDOMEN WITHOUT CONTRAST  (INCLUDING MRCP) TECHNIQUE: Multiplanar multisequence MR imaging of the abdomen was performed. Heavily T2-weighted images of the biliary and pancreatic ducts were obtained, and three-dimensional MRCP images were rendered by post  processing. COMPARISON:  Ultrasound of earlier today.  05/11/2016 CT. FINDINGS: Lower chest: Normal heart size without pericardial or pleural effusion. Hepatobiliary: Hepatomegaly at 19.8 cm craniocaudal. Moderate to severe hepatic steatosis. Subcentimeter segment 4 B liver lesion is likely a cyst. Multiple gallstones without specific evidence of acute cholecystitis. No intrahepatic biliary duct dilatation. The common duct measures 6-7 mm, including on image 04/1011. Apparent common duct filling defect on image 8/15 is not confirmed on other pulse sequences, favored to be artifactual. Pancreas:  Normal, without mass or ductal dilatation. Spleen:  Normal in size, without focal abnormality. Adrenals/Urinary Tract: Normal adrenal glands. Bilateral T2 hyperintense renal lesions are likely cysts.  Stomach/Bowel: Normal stomach and abdominal bowel loops. Vascular/Lymphatic: Normal caliber of the aorta and branch vessels. No retroperitoneal or retrocrural adenopathy. Other:  No ascites. Musculoskeletal: No acute osseous abnormality. Mild degradation secondary to patient body habitus. IMPRESSION: 1. Cholelithiasis. 2. No biliary duct dilatation or evidence of choledocholithiasis. 3. Hepatic steatosis and hepatomegaly. Electronically Signed   By: Abigail Miyamoto M.D.   On: 10/29/2017 22:23   Mr 3d Recon At Scanner  Result Date: 10/29/2017 CLINICAL DATA:  Cholelithiasis. Mild common duct dilatation on ultrasound. EXAM: MRI ABDOMEN WITHOUT CONTRAST  (INCLUDING MRCP) TECHNIQUE: Multiplanar multisequence MR imaging of the abdomen was performed. Heavily T2-weighted images of the biliary and pancreatic ducts were obtained, and three-dimensional MRCP images were rendered by post processing. COMPARISON:  Ultrasound of earlier today.  05/11/2016 CT. FINDINGS: Lower chest: Normal heart size without pericardial or pleural effusion. Hepatobiliary: Hepatomegaly at 19.8 cm craniocaudal. Moderate to severe hepatic steatosis. Subcentimeter segment 4 B liver lesion is likely a cyst. Multiple gallstones without specific evidence of acute cholecystitis. No intrahepatic biliary duct dilatation. The common duct measures 6-7 mm, including on image 04/1011. Apparent common duct filling defect on image 8/15 is not confirmed on other pulse sequences, favored to be artifactual. Pancreas:  Normal, without mass or ductal dilatation. Spleen:  Normal in size, without focal abnormality. Adrenals/Urinary Tract: Normal adrenal glands. Bilateral T2 hyperintense renal lesions are likely cysts. Stomach/Bowel: Normal stomach and abdominal bowel loops. Vascular/Lymphatic: Normal caliber of the aorta and branch vessels. No retroperitoneal or retrocrural adenopathy. Other:  No ascites. Musculoskeletal: No acute osseous abnormality. Mild degradation  secondary to patient body habitus. IMPRESSION: 1. Cholelithiasis. 2. No biliary duct dilatation or evidence of choledocholithiasis. 3. Hepatic steatosis and hepatomegaly. Electronically Signed   By: Abigail Miyamoto M.D.   On: 10/29/2017 22:23   US Abdomen Limited Ruq  Result Date: 10/29/2017 CLINICAL DATA:  Upper abdominal pain EXAM: ULTRASOUND ABDOMEN LIMITED RIGHT UPPER QUADRANT COMPARISON:  CT abdomen and pelvis May 11, 2016 FINDINGS: Gallbladder: Within the gallbladder, there are echogenic foci which move and shadow consistent with cholelithiasis. Largest gallstone measures 1 cm in length. There is intermittent sludge in the gallbladder as well. There is no appreciable gallbladder wall thickening or pericholecystic fluid. No sonographic Murphy sign noted by sonographer. Common bile duct: Diameter: 8 mm, mildly dilated. No biliary duct mass or calculus is appreciable on this study. Liver: No focal lesion identified. Liver echogenicity is increased diffusely. Portal vein is patent on color Doppler imaging with normal direction of blood flow towards the liver. IMPRESSION: 1. Cholelithiasis and sludge within the gallbladder. No appreciable gallbladder wall thickening evident. No pericholecystic fluid. 2. Mild dilatation of the distal common bile duct without mass or calculus appreciable by ultrasound. If further evaluation of the biliary ductal system is felt to be warranted, MRCP from an imaging standpoint  would be the optimum study of choice to further evaluate. 3. Diffuse increase in liver echogenicity, a finding felt to be indicative of hepatic steatosis. While no focal liver lesions are evident on this study, it must be cautioned that the sensitivity of ultrasound for detection of focal liver lesions is diminished significantly in this circumstance. Electronically Signed   By: Lowella Grip III M.D.   On: 10/29/2017 11:51    Assessment/Plan:  Studies reviewed.  Labs reviewed.  LFTs were initially  elevated and have returned to near normal.  This with a negative work-up via MRCP suggest that he passed a stone.  I discussed with he and his sister the options of observation or outpatient surgery or surgery during this hospitalization which she has chosen we discussed the risks of bleeding infection recurrence of symptoms failure to resolve his symptoms conversion to an open procedure bile duct damage bile duct leak retained common bile duct stone requiring ERCP and stone extraction.  We also discussed repairing the small umbilical hernia primarily without mesh.  Questions were answered for them they understood and agreed to proceed  Florene Glen, MD, FACS

## 2017-10-30 NOTE — Progress Notes (Signed)
Midge Miniumarren Joram Venson, MD [facility]   [address] [place], [place] [address] Phone: [phone] Fax : [phone]   Subjective: This patient came in with abdominal pain and questionable common bile duct stone.  The patient had an MRI with MRCP yesterday without any stones seen in the common bile duct.   Objective: Vital signs in last 24 hours: Vitals:   10/29/17 1539 10/29/17 1720 10/29/17 2041 10/30/17 0555  BP: 122/80 122/82 99/77 114/70  Pulse: 87 85 89 80  Resp: 16 19 20 20   Temp:  98 F (36.7 C) 98.6 F (37 C) 98.2 F (36.8 C)  TempSrc:  Oral Oral Oral  SpO2: 96% 97% 97% 95%  Weight:      Height:       Weight change:   Intake/Output Summary (Last 24 hours) at 10/30/2017 1115 Last data filed at 10/30/2017 0941 Gross per 24 hour  Intake 1099.5 ml  Output 2025 ml  Net -925.5 ml     Exam: Gen: Alert and orientated x3 in no apparent distress normocephalic atraumatic Extremities: Without cyanosis clubbing or edema   Lab Results: @LABTEST2 @ Micro Results: No results found for this or any previous visit (from the past 240 hour(s)). Studies/Results: Mr Abdomen Mrcp Wo Contrast  Result Date: 10/29/2017 CLINICAL DATA:  Cholelithiasis. Mild common duct dilatation on ultrasound. EXAM: MRI ABDOMEN WITHOUT CONTRAST  (INCLUDING MRCP) TECHNIQUE: Multiplanar multisequence MR imaging of the abdomen was performed. Heavily T2-weighted images of the biliary and pancreatic ducts were obtained, and three-dimensional MRCP images were rendered by post processing. COMPARISON:  Ultrasound of earlier today.  05/11/2016 CT. FINDINGS: Lower chest: Normal heart size without pericardial or pleural effusion. Hepatobiliary: Hepatomegaly at 19.8 cm craniocaudal. Moderate to severe hepatic steatosis. Subcentimeter segment 4 B liver lesion is likely a cyst. Multiple gallstones without specific evidence of acute cholecystitis. No intrahepatic biliary duct dilatation. The common duct measures 6-7 mm,  including on image 04/1011. Apparent common duct filling defect on image 8/15 is not confirmed on other pulse sequences, favored to be artifactual. Pancreas:  Normal, without mass or ductal dilatation. Spleen:  Normal in size, without focal abnormality. Adrenals/Urinary Tract: Normal adrenal glands. Bilateral T2 hyperintense renal lesions are likely cysts. Stomach/Bowel: Normal stomach and abdominal bowel loops. Vascular/Lymphatic: Normal caliber of the aorta and branch vessels. No retroperitoneal or retrocrural adenopathy. Other:  No ascites. Musculoskeletal: No acute osseous abnormality. Mild degradation secondary to patient body habitus. IMPRESSION: 1. Cholelithiasis. 2. No biliary duct dilatation or evidence of choledocholithiasis. 3. Hepatic steatosis and hepatomegaly. Electronically Signed   By: Jeronimo GreavesKyle  Talbot M.D.   On: 10/29/2017 22:23   Mr 3d Recon At Scanner  Result Date: 10/29/2017 CLINICAL DATA:  Cholelithiasis. Mild common duct dilatation on ultrasound. EXAM: MRI ABDOMEN WITHOUT CONTRAST  (INCLUDING MRCP) TECHNIQUE: Multiplanar multisequence MR imaging of the abdomen was performed. Heavily T2-weighted images of the biliary and pancreatic ducts were obtained, and three-dimensional MRCP images were rendered by post processing. COMPARISON:  Ultrasound of earlier today.  05/11/2016 CT. FINDINGS: Lower chest: Normal heart size without pericardial or pleural effusion. Hepatobiliary: Hepatomegaly at 19.8 cm craniocaudal. Moderate to severe hepatic steatosis. Subcentimeter segment 4 B liver lesion is likely a cyst. Multiple gallstones without specific evidence of acute cholecystitis. No intrahepatic biliary duct dilatation. The common duct measures 6-7 mm, including on image 04/1011. Apparent common duct filling defect on image 8/15 is not confirmed on other pulse sequences, favored to be artifactual. Pancreas:  Normal, without mass or ductal dilatation. Spleen:  Normal in size, without focal abnormality.  Adrenals/Urinary Tract: Normal adrenal glands. Bilateral T2 hyperintense renal lesions are likely cysts. Stomach/Bowel: Normal stomach and abdominal bowel loops. Vascular/Lymphatic: Normal caliber of the aorta and branch vessels. No retroperitoneal or retrocrural adenopathy. Other:  No ascites. Musculoskeletal: No acute osseous abnormality. Mild degradation secondary to patient body habitus. IMPRESSION: 1. Cholelithiasis. 2. No biliary duct dilatation or evidence of choledocholithiasis. 3. Hepatic steatosis and hepatomegaly. Electronically Signed   By: Jeronimo Greaves M.D.   On: 10/29/2017 22:23   US Abdomen Limited Ruq  Result Date: 10/29/2017 CLINICAL DATA:  Upper abdominal pain EXAM: ULTRASOUND ABDOMEN LIMITED RIGHT UPPER QUADRANT COMPARISON:  CT abdomen and pelvis May 11, 2016 FINDINGS: Gallbladder: Within the gallbladder, there are echogenic foci which move and shadow consistent with cholelithiasis. Largest gallstone measures 1 cm in length. There is intermittent sludge in the gallbladder as well. There is no appreciable gallbladder wall thickening or pericholecystic fluid. No sonographic Murphy sign noted by sonographer. Common bile duct: Diameter: 8 mm, mildly dilated. No biliary duct mass or calculus is appreciable on this study. Liver: No focal lesion identified. Liver echogenicity is increased diffusely. Portal vein is patent on color Doppler imaging with normal direction of blood flow towards the liver. IMPRESSION: 1. Cholelithiasis and sludge within the gallbladder. No appreciable gallbladder wall thickening evident. No pericholecystic fluid. 2. Mild dilatation of the distal common bile duct without mass or calculus appreciable by ultrasound. If further evaluation of the biliary ductal system is felt to be warranted, MRCP from an imaging standpoint would be the optimum study of choice to further evaluate. 3. Diffuse increase in liver echogenicity, a finding felt to be indicative of hepatic steatosis.  While no focal liver lesions are evident on this study, it must be cautioned that the sensitivity of ultrasound for detection of focal liver lesions is diminished significantly in this circumstance. Electronically Signed   By: Bretta Bang III M.D.   On: 10/29/2017 11:51   Medications: I have reviewed the patient's current medications. Scheduled Meds: . allopurinol  300 mg Oral Daily  . carbidopa-levodopa  1 tablet Oral TID  . indapamide  2.5 mg Oral Daily  . insulin aspart  0-9 Units Subcutaneous TID WC  . lisinopril  10 mg Oral Daily  . pneumococcal 23 valent vaccine  0.5 mL Intramuscular Tomorrow-1000   Continuous Infusions: . sodium chloride 75 mL/hr at 10/30/17 0941   PRN Meds:.acetaminophen **OR** acetaminophen, albuterol, diazepam, morphine injection, ondansetron **OR** ondansetron (ZOFRAN) IV, oxyCODONE   Assessment: Active Problems:   Abdominal pain    Plan: This patient had an MRCP without any stones in the bile duct.  Nothing further to do from a GI point of view.  Will be up to the hospitalist service and surgery on whether the gallbladder should come out while in the hospital or after discharge.  The patient states that he has had multiple attacks from his gallbladder in the past.  The patient has been explained the plan and agrees with it.   LOS: 1 day   Midge Minium 10/30/2017, 11:15 AM

## 2017-10-30 NOTE — Progress Notes (Signed)
Patient complaining of not being able to eat or drink at this time. Dr. Katheren ShamsSalary and Dr. Excell Seltzerooper were contacted. Dr. Excell Seltzerooper will be by later to see the patient. Dr. Katheren ShamsSalary states that he highly recommends remaining NPO until the surgeon consults, that he will order whatever diet the patient wants, but again, he highly advises against eating and drinking prior to the surgeon seeing the patient. Patient was made aware of this and reluctantly agreed to wait to see the surgeon.

## 2017-10-30 NOTE — Anesthesia Preprocedure Evaluation (Addendum)
Anesthesia Evaluation  Patient identified by MRN, date of birth, ID band Patient awake    Reviewed: Allergy & Precautions, NPO status , Patient's Chart, lab work & pertinent test results  Airway Mallampati: II  TM Distance: >3 FB     Dental  (+) Teeth Intact, Chipped   Pulmonary asthma , sleep apnea , former smoker,    - rhonchi       Cardiovascular Exercise Tolerance: Good hypertension, Pt. on medications  Rhythm:Regular Rate:Normal     Neuro/Psych negative neurological ROS  negative psych ROS   GI/Hepatic Neg liver ROS,   Endo/Other  diabetes, Well Controlled, Type 2, Oral Hypoglycemic AgentsMorbid obesity  Renal/GU Renal InsufficiencyRenal diseasenegative Renal ROS     Musculoskeletal  (+) Arthritis , Osteoarthritis,    Abdominal (+) + obese,   Peds  Hematology negative hematology ROS (+)   Anesthesia Other Findings   Reproductive/Obstetrics                            Anesthesia Physical  Anesthesia Plan  ASA: III  Anesthesia Plan: General   Post-op Pain Management:    Induction: Intravenous, Rapid sequence and Cricoid pressure planned  PONV Risk Score and Plan:   Airway Management Planned: Oral ETT  Additional Equipment:   Intra-op Plan:   Post-operative Plan: Extubation in OR  Informed Consent: I have reviewed the patients History and Physical, chart, labs and discussed the procedure including the risks, benefits and alternatives for the proposed anesthesia with the patient or authorized representative who has indicated his/her understanding and acceptance.     Plan Discussed with: CRNA and Surgeon  Anesthesia Plan Comments:         Anesthesia Quick Evaluation

## 2017-10-30 NOTE — Progress Notes (Signed)
[employer] - [facility]   PATIENT NAME: Jerry Sandoval    MR#:  [id number]  DATE OF BIRTH:  [birth year]  SUBJECTIVE:  CHIEF COMPLAINT:   Chief Complaint  Patient presents with  . Abdominal Pain  Continues to complain of abdominal pain, patient demands to eat food, general surgery to see, case discussed with gastroenterology  REVIEW OF SYSTEMS:  CONSTITUTIONAL: No fever, fatigue or weakness.  EYES: No blurred or double vision.  EARS, NOSE, AND THROAT: No tinnitus or ear pain.  RESPIRATORY: No cough, shortness of breath, wheezing or hemoptysis.  CARDIOVASCULAR: No chest pain, orthopnea, edema.  GASTROINTESTINAL: No nausea, vomiting, diarrhea or abdominal pain.  GENITOURINARY: No dysuria, hematuria.  ENDOCRINE: No polyuria, nocturia,  HEMATOLOGY: No anemia, easy bruising or bleeding SKIN: No rash or lesion. MUSCULOSKELETAL: No joint pain or arthritis.   NEUROLOGIC: No tingling, numbness, weakness.  PSYCHIATRY: No anxiety or depression.   ROS  DRUG ALLERGIES:  No Known Allergies  VITALS:  Blood pressure 114/70, pulse 80, temperature 98.2 F (36.8 C), temperature source Oral, resp. rate 20, height 5\' 10"  (1.778 m), weight (!) 142.4 kg, SpO2 95 %.  PHYSICAL EXAMINATION:  GENERAL:  52 y.o.-year-old patient lying in the bed with no acute distress.  EYES: Pupils equal, round, reactive to light and accommodation. No scleral icterus. Extraocular muscles intact.  HEENT: Head atraumatic, normocephalic. Oropharynx and nasopharynx clear.  NECK:  Supple, no jugular venous distention. No thyroid enlargement, no tenderness.  LUNGS: Normal breath sounds bilaterally, no wheezing, rales,rhonchi or crepitation. No use of accessory muscles of respiration.  CARDIOVASCULAR: S1, S2 normal. No murmurs, rubs, or gallops.  ABDOMEN: Soft, nontender, nondistended. Bowel sounds present. No organomegaly or mass.  EXTREMITIES: No pedal edema, cyanosis, or clubbing.   NEUROLOGIC: Cranial nerves II through XII are intact. Muscle strength 5/5 in all extremities. Sensation intact. Gait not checked.  PSYCHIATRIC: The patient is alert and oriented x 3.  SKIN: No obvious rash, lesion, or ulcer.   Physical Exam LABORATORY PANEL:   CBC Recent Labs  Lab 10/30/17 0518  WBC 7.3  HGB 15.0  HCT 42.9  PLT 201   ------------------------------------------------------------------------------------------------------------------  Chemistries  Recent Labs  Lab 10/30/17 0518  NA 136  K 3.6  CL 101  CO2 28  GLUCOSE 135*  BUN 10  CREATININE 1.02  CALCIUM 9.2  AST 193*  ALT 37  ALKPHOS 73  BILITOT 1.9*   ------------------------------------------------------------------------------------------------------------------  Cardiac Enzymes Recent Labs  Lab 10/29/17 1232  TROPONINI <0.03   ------------------------------------------------------------------------------------------------------------------  RADIOLOGY:  Mr Abdomen Mrcp Wo Contrast  Result Date: 10/29/2017 CLINICAL DATA:  Cholelithiasis. Mild common duct dilatation on ultrasound. EXAM: MRI ABDOMEN WITHOUT CONTRAST  (INCLUDING MRCP) TECHNIQUE: Multiplanar multisequence MR imaging of the abdomen was performed. Heavily T2-weighted images of the biliary and pancreatic ducts were obtained, and three-dimensional MRCP images were rendered by post processing. COMPARISON:  Ultrasound of earlier today.  05/11/2016 CT. FINDINGS: Lower chest: Normal heart size without pericardial or pleural effusion. Hepatobiliary: Hepatomegaly at 19.8 cm craniocaudal. Moderate to severe hepatic steatosis. Subcentimeter segment 4 B liver lesion is likely a cyst. Multiple gallstones without specific evidence of acute cholecystitis. No intrahepatic biliary duct dilatation. The common duct measures 6-7 mm, including on image 04/1011. Apparent common duct filling defect on image 8/15 is not confirmed on other pulse sequences, favored  to be artifactual. Pancreas:  Normal, without mass or ductal dilatation. Spleen:  Normal in size, without focal abnormality. Adrenals/Urinary Tract: Normal adrenal glands.  Bilateral T2 hyperintense renal lesions are likely cysts. Stomach/Bowel: Normal stomach and abdominal bowel loops. Vascular/Lymphatic: Normal caliber of the aorta and branch vessels. No retroperitoneal or retrocrural adenopathy. Other:  No ascites. Musculoskeletal: No acute osseous abnormality. Mild degradation secondary to patient body habitus. IMPRESSION: 1. Cholelithiasis. 2. No biliary duct dilatation or evidence of choledocholithiasis. 3. Hepatic steatosis and hepatomegaly. Electronically Signed   By: Jeronimo Greaves M.D.   On: 10/29/2017 22:23   Mr 3d Recon At Scanner  Result Date: 10/29/2017 CLINICAL DATA:  Cholelithiasis. Mild common duct dilatation on ultrasound. EXAM: MRI ABDOMEN WITHOUT CONTRAST  (INCLUDING MRCP) TECHNIQUE: Multiplanar multisequence MR imaging of the abdomen was performed. Heavily T2-weighted images of the biliary and pancreatic ducts were obtained, and three-dimensional MRCP images were rendered by post processing. COMPARISON:  Ultrasound of earlier today.  05/11/2016 CT. FINDINGS: Lower chest: Normal heart size without pericardial or pleural effusion. Hepatobiliary: Hepatomegaly at 19.8 cm craniocaudal. Moderate to severe hepatic steatosis. Subcentimeter segment 4 B liver lesion is likely a cyst. Multiple gallstones without specific evidence of acute cholecystitis. No intrahepatic biliary duct dilatation. The common duct measures 6-7 mm, including on image 04/1011. Apparent common duct filling defect on image 8/15 is not confirmed on other pulse sequences, favored to be artifactual. Pancreas:  Normal, without mass or ductal dilatation. Spleen:  Normal in size, without focal abnormality. Adrenals/Urinary Tract: Normal adrenal glands. Bilateral T2 hyperintense renal lesions are likely cysts. Stomach/Bowel: Normal  stomach and abdominal bowel loops. Vascular/Lymphatic: Normal caliber of the aorta and branch vessels. No retroperitoneal or retrocrural adenopathy. Other:  No ascites. Musculoskeletal: No acute osseous abnormality. Mild degradation secondary to patient body habitus. IMPRESSION: 1. Cholelithiasis. 2. No biliary duct dilatation or evidence of choledocholithiasis. 3. Hepatic steatosis and hepatomegaly. Electronically Signed   By: Jeronimo Greaves M.D.   On: 10/29/2017 22:23   US Abdomen Limited Ruq  Result Date: 10/29/2017 CLINICAL DATA:  Upper abdominal pain EXAM: ULTRASOUND ABDOMEN LIMITED RIGHT UPPER QUADRANT COMPARISON:  CT abdomen and pelvis May 11, 2016 FINDINGS: Gallbladder: Within the gallbladder, there are echogenic foci which move and shadow consistent with cholelithiasis. Largest gallstone measures 1 cm in length. There is intermittent sludge in the gallbladder as well. There is no appreciable gallbladder wall thickening or pericholecystic fluid. No sonographic Murphy sign noted by sonographer. Common bile duct: Diameter: 8 mm, mildly dilated. No biliary duct mass or calculus is appreciable on this study. Liver: No focal lesion identified. Liver echogenicity is increased diffusely. Portal vein is patent on color Doppler imaging with normal direction of blood flow towards the liver. IMPRESSION: 1. Cholelithiasis and sludge within the gallbladder. No appreciable gallbladder wall thickening evident. No pericholecystic fluid. 2. Mild dilatation of the distal common bile duct without mass or calculus appreciable by ultrasound. If further evaluation of the biliary ductal system is felt to be warranted, MRCP from an imaging standpoint would be the optimum study of choice to further evaluate. 3. Diffuse increase in liver echogenicity, a finding felt to be indicative of hepatic steatosis. While no focal liver lesions are evident on this study, it must be cautioned that the sensitivity of ultrasound for detection  of focal liver lesions is diminished significantly in this circumstance. Electronically Signed   By: Bretta Bang III M.D.   On: 10/29/2017 11:51    ASSESSMENT AND PLAN:  *Acute abdominal pain Acute choledocholithiasis ruled out on MRI of the abdomen-noted gallstones with hepatic steatosis General surgery to see for possible gallbladder involvement Input appreciated-no intervention  recommended  * Chronic morbid obesity Likely due to excess calories Lifestyle Modification recommended  *Chronic diabetes type 2  Stable  Sliding scale insulin with Accu-Cheks per routine  Continue to hold metformin   *Chronic hypertension  Stable on current regiment   *Chronic OSA Continue home CPAP  Disposition pending clinical course, general surgery clearance    All the records are reviewed and case discussed with Care Management/Social Workerr. Management plans discussed with the patient, family and they are in agreement.  CODE STATUS: full  TOTAL TIME TAKING CARE OF THIS PATIENT: 40 minutes.     POSSIBLE D/C IN 1-2 DAYS, DEPENDING ON CLINICAL CONDITION.   Evelena AsaMontell D Marcellis Frampton M.D on 10/30/2017   Between 7am to 6pm - Pager - [phone]  After 6pm go to [email or web address] - password [email or web address]  [facility]  Office  [phone]  CC: Primary care physician; Danella PentonMiller, Mark F, MD  Note: This dictation was prepared with Dragon dictation along with smaller phrase technology. Any transcriptional errors that result from this process are unintentional.

## 2017-10-31 ENCOUNTER — Inpatient Hospital Stay: Payer: BLUE CROSS/BLUE SHIELD | Admitting: Anesthesiology

## 2017-10-31 ENCOUNTER — Encounter
Admission: EM | Disposition: A | Discharge status: Home / Self Care | Payer: Self-pay | Source: Home / Self Care | Attending: Family Medicine

## 2017-10-31 ENCOUNTER — Inpatient Hospital Stay: Payer: BLUE CROSS/BLUE SHIELD

## 2017-10-31 DIAGNOSIS — K429 Umbilical hernia without obstruction or gangrene: Secondary | ICD-10-CM

## 2017-10-31 DIAGNOSIS — R17 Unspecified jaundice: Secondary | ICD-10-CM

## 2017-10-31 HISTORY — PX: CHOLECYSTECTOMY: SHX55

## 2017-10-31 LAB — CBC WITH DIFFERENTIAL/PLATELET
Basophils Absolute: 0 10*3/uL (ref 0–0.1)
Basophils Relative: 0 %
Eosinophils Absolute: 0.2 10*3/uL (ref 0–0.7)
Eosinophils Relative: 2 %
HCT: 42.9 % (ref 40.0–52.0)
Hemoglobin: 15.1 g/dL (ref 13.0–18.0)
Lymphocytes Relative: 9 %
Lymphs Abs: 0.8 10*3/uL — ABNORMAL LOW (ref 1.0–3.6)
MCH: 32.5 pg (ref 26.0–34.0)
MCHC: 35.1 g/dL (ref 32.0–36.0)
MCV: 92.6 fL (ref 80.0–100.0)
Monocytes Absolute: 0.7 10*3/uL (ref 0.2–1.0)
Monocytes Relative: 7 %
Neutro Abs: 7.8 10*3/uL — ABNORMAL HIGH (ref 1.4–6.5)
Neutrophils Relative %: 82 %
Platelets: 217 10*3/uL (ref 150–440)
RBC: 4.63 MIL/uL (ref 4.40–5.90)
RDW: 13.9 % (ref 11.5–14.5)
WBC: 9.6 10*3/uL (ref 3.8–10.6)

## 2017-10-31 LAB — COMPREHENSIVE METABOLIC PANEL
ALT: 76 U/L — ABNORMAL HIGH (ref 0–44)
AST: 114 U/L — ABNORMAL HIGH (ref 15–41)
Albumin: 4.2 g/dL (ref 3.5–5.0)
Alkaline Phosphatase: 73 U/L (ref 38–126)
Anion gap: 12 (ref 5–15)
BUN: 11 mg/dL (ref 6–20)
CO2: 26 mmol/L (ref 22–32)
Calcium: 9.2 mg/dL (ref 8.9–10.3)
Chloride: 100 mmol/L (ref 98–111)
Creatinine, Ser: 0.97 mg/dL (ref 0.61–1.24)
GFR calc Af Amer: >60 mL/min (ref >60)
GFR calc non Af Amer: >60 mL/min (ref >60)
Glucose, Bld: 145 mg/dL — ABNORMAL HIGH (ref 70–99)
Potassium: 3.8 mmol/L (ref 3.5–5.1)
Sodium: 138 mmol/L (ref 135–145)
Total Bilirubin: 1.3 mg/dL — ABNORMAL HIGH (ref 0.3–1.2)
Total Protein: 7.4 g/dL (ref 6.5–8.1)

## 2017-10-31 LAB — GLUCOSE, CAPILLARY
Glucose-Capillary: 182 mg/dL — ABNORMAL HIGH (ref 70–99)
Glucose-Capillary: 196 mg/dL — ABNORMAL HIGH (ref 70–99)
Glucose-Capillary: 189 mg/dL — ABNORMAL HIGH (ref 70–99)
Glucose-Capillary: 145 mg/dL — ABNORMAL HIGH (ref 70–99)

## 2017-10-31 SURGERY — LAPAROSCOPIC CHOLECYSTECTOMY
Anesthesia: General

## 2017-10-31 MED ORDER — ROCURONIUM BROMIDE 50 MG/5ML IV SOLN
INTRAVENOUS | Status: AC
  Filled 2017-10-31: qty 1

## 2017-10-31 MED ORDER — MIDAZOLAM HCL 2 MG/2ML IJ SOLN
INTRAMUSCULAR | Status: AC
  Filled 2017-10-31: qty 2

## 2017-10-31 MED ORDER — ONDANSETRON HCL 4 MG/2ML IJ SOLN: 4.0000 mg | Freq: Once | INTRAMUSCULAR | Status: DC | PRN

## 2017-10-31 MED ORDER — SUCCINYLCHOLINE CHLORIDE 20 MG/ML IJ SOLN
INTRAMUSCULAR | Status: DC | PRN
  Administered 2017-10-31: 140 mg via INTRAVENOUS

## 2017-10-31 MED ORDER — SUGAMMADEX SODIUM 500 MG/5ML IV SOLN
INTRAVENOUS | Status: DC | PRN
  Administered 2017-10-31: 290 mg via INTRAVENOUS

## 2017-10-31 MED ORDER — SUCCINYLCHOLINE CHLORIDE 20 MG/ML IJ SOLN
INTRAMUSCULAR | Status: AC
  Filled 2017-10-31: qty 1

## 2017-10-31 MED ORDER — MIDAZOLAM HCL 2 MG/2ML IJ SOLN
INTRAMUSCULAR | Status: DC | PRN
  Administered 2017-10-31: 2 mg via INTRAVENOUS

## 2017-10-31 MED ORDER — ONDANSETRON HCL 4 MG/2ML IJ SOLN
INTRAMUSCULAR | Status: DC | PRN
  Administered 2017-10-31: 4 mg via INTRAVENOUS

## 2017-10-31 MED ORDER — ONDANSETRON HCL 4 MG/2ML IJ SOLN
INTRAMUSCULAR | Status: AC
  Filled 2017-10-31: qty 2

## 2017-10-31 MED ORDER — FENTANYL CITRATE (PF) 250 MCG/5ML IJ SOLN
INTRAMUSCULAR | Status: AC
  Filled 2017-10-31: qty 5

## 2017-10-31 MED ORDER — KETOROLAC TROMETHAMINE 30 MG/ML IJ SOLN
INTRAMUSCULAR | Status: AC
  Filled 2017-10-31: qty 1

## 2017-10-31 MED ORDER — FENTANYL CITRATE (PF) 100 MCG/2ML IJ SOLN
25.0000 ug | INTRAMUSCULAR | Status: DC | PRN
  Administered 2017-10-31 (×4): 25 ug via INTRAVENOUS

## 2017-10-31 MED ORDER — DEXAMETHASONE SODIUM PHOSPHATE 10 MG/ML IJ SOLN
INTRAMUSCULAR | Status: AC
  Filled 2017-10-31: qty 1

## 2017-10-31 MED ORDER — FENTANYL CITRATE (PF) 100 MCG/2ML IJ SOLN
INTRAMUSCULAR | Status: DC | PRN
  Administered 2017-10-31: 50 ug via INTRAVENOUS
  Administered 2017-10-31 (×2): 100 ug via INTRAVENOUS

## 2017-10-31 MED ORDER — FENTANYL CITRATE (PF) 100 MCG/2ML IJ SOLN
INTRAMUSCULAR | Status: AC
  Filled 2017-10-31: qty 2

## 2017-10-31 MED ORDER — HYDROMORPHONE HCL 1 MG/ML IJ SOLN: 0.5000 mg | INTRAMUSCULAR | Status: DC | PRN

## 2017-10-31 MED ORDER — SEVOFLURANE IN SOLN
RESPIRATORY_TRACT | Status: AC
  Filled 2017-10-31: qty 250

## 2017-10-31 MED ORDER — BUPIVACAINE-EPINEPHRINE (PF) 0.25% -1:200000 IJ SOLN
INTRAMUSCULAR | Status: AC
  Filled 2017-10-31: qty 30

## 2017-10-31 MED ORDER — PROPOFOL 10 MG/ML IV BOLUS
INTRAVENOUS | Status: AC
  Filled 2017-10-31: qty 20

## 2017-10-31 MED ORDER — ROCURONIUM BROMIDE 100 MG/10ML IV SOLN
INTRAVENOUS | Status: DC | PRN
  Administered 2017-10-31 (×3): 10 mg via INTRAVENOUS
  Administered 2017-10-31: 5 mg via INTRAVENOUS
  Administered 2017-10-31: 45 mg via INTRAVENOUS

## 2017-10-31 MED ORDER — SUGAMMADEX SODIUM 500 MG/5ML IV SOLN
INTRAVENOUS | Status: AC
  Filled 2017-10-31: qty 5

## 2017-10-31 MED ORDER — DEXAMETHASONE SODIUM PHOSPHATE 10 MG/ML IJ SOLN
INTRAMUSCULAR | Status: DC | PRN
  Administered 2017-10-31: 5 mg via INTRAVENOUS

## 2017-10-31 MED ORDER — PROPOFOL 10 MG/ML IV BOLUS
INTRAVENOUS | Status: DC | PRN
  Administered 2017-10-31: 200 mg via INTRAVENOUS

## 2017-10-31 MED ORDER — KETOROLAC TROMETHAMINE 30 MG/ML IJ SOLN
INTRAMUSCULAR | Status: DC | PRN
  Administered 2017-10-31: 30 mg via INTRAVENOUS

## 2017-10-31 MED ORDER — BUPIVACAINE-EPINEPHRINE (PF) 0.25% -1:200000 IJ SOLN
INTRAMUSCULAR | Status: DC | PRN
  Administered 2017-10-31: 30 mL

## 2017-10-31 MED ORDER — IOTHALAMATE MEGLUMINE 60 % INJ SOLN
INTRAMUSCULAR | Status: DC | PRN
  Administered 2017-10-31: 30 mL

## 2017-10-31 SURGICAL SUPPLY — 41 items
ADHESIVE MASTISOL STRL (MISCELLANEOUS) ×2 IMPLANT
APPLIER CLIP ROT 10 11.4 M/L (STAPLE) ×2
BLADE SURG SZ11 CARB STEEL (BLADE) ×2 IMPLANT
CANISTER SUCT 1200ML W/VALVE (MISCELLANEOUS) ×2 IMPLANT
CATH CHOLANGI 4FR 420404F (CATHETERS) ×2 IMPLANT
CHLORAPREP W/TINT 26ML (MISCELLANEOUS) ×2 IMPLANT
CLIP APPLIE ROT 10 11.4 M/L (STAPLE) ×1 IMPLANT
CONRAY 60ML FOR OR (MISCELLANEOUS) IMPLANT
DRAPE C-ARM XRAY 36X54 (DRAPES) ×2 IMPLANT
ELECT REM PT RETURN 9FT ADLT (ELECTROSURGICAL) ×2
ELECTRODE REM PT RTRN 9FT ADLT (ELECTROSURGICAL) ×1 IMPLANT
GLOVE BIO SURGEON STRL SZ8 (GLOVE) ×10 IMPLANT
GOWN STRL REUS W/ TWL LRG LVL3 (GOWN DISPOSABLE) ×3 IMPLANT
GOWN STRL REUS W/TWL LRG LVL3 (GOWN DISPOSABLE) ×3
IRRIGATION STRYKERFLOW (MISCELLANEOUS) ×1 IMPLANT
IRRIGATOR STRYKERFLOW (MISCELLANEOUS) ×2
IV CATH ANGIO 12GX3 LT BLUE (NEEDLE) ×2 IMPLANT
IV NS 1000ML (IV SOLUTION) ×1
IV NS 1000ML BAXH (IV SOLUTION) ×1 IMPLANT
JACKSON PRATT 10 (INSTRUMENTS) ×2 IMPLANT
KIT TURNOVER KIT A (KITS) ×2 IMPLANT
LABEL OR SOLS (LABEL) ×2 IMPLANT
NEEDLE HYPO 22GX1.5 SAFETY (NEEDLE) ×2 IMPLANT
NEEDLE VERESS 14GA 120MM (NEEDLE) ×2 IMPLANT
NS IRRIG 500ML POUR BTL (IV SOLUTION) ×2 IMPLANT
PACK LAP CHOLECYSTECTOMY (MISCELLANEOUS) ×2 IMPLANT
POUCH SPECIMEN RETRIEVAL 10MM (ENDOMECHANICALS) ×2 IMPLANT
SCISSORS METZENBAUM CVD 33 (INSTRUMENTS) ×2 IMPLANT
SLEEVE ENDOPATH XCEL 5M (ENDOMECHANICALS) ×4 IMPLANT
SPONGE GAUZE 2X2 8PLY STRL LF (GAUZE/BANDAGES/DRESSINGS) ×8 IMPLANT
SPONGE LAP 18X18 RF (DISPOSABLE) ×2 IMPLANT
SPONGE VERSALON 4X4 4PLY (MISCELLANEOUS) IMPLANT
STRIP CLOSURE SKIN 1/2X4 (GAUZE/BANDAGES/DRESSINGS) ×2 IMPLANT
SUT MNCRL 4-0 (SUTURE) ×1
SUT MNCRL 4-0 27XMFL (SUTURE) ×1
SUT VICRYL 0 AB UR-6 (SUTURE) ×2 IMPLANT
SUTURE MNCRL 4-0 27XMF (SUTURE) ×1 IMPLANT
SYR 20CC LL (SYRINGE) ×2 IMPLANT
TROCAR XCEL NON-BLD 11X100MML (ENDOMECHANICALS) ×2 IMPLANT
TROCAR XCEL NON-BLD 5MMX100MML (ENDOMECHANICALS) ×2 IMPLANT
TUBING INSUFFLATION (TUBING) ×2 IMPLANT

## 2017-10-31 NOTE — Transfer of Care (Signed)
Immediate Anesthesia Transfer of Care Note  [name]  Procedure(s) Performed: LAPAROSCOPIC CHOLECYSTECTOMY and umbilical hernia repair (N/A )  Patient Location: PACU  Anesthesia Type:General  Level of Consciousness: sedated and responds to stimulation  Airway & Oxygen Therapy: Patient Spontanous Breathing and Patient connected to face mask oxygen  Post-op Assessment: Report given to RN and Post -op Vital signs reviewed and stable  Post vital signs: Reviewed and stable  Last Vitals:  Vitals Value Taken Time  BP 115/72 10/31/2017  9:29 AM  Temp 36.3 C 10/31/2017  9:28 AM  Pulse 106 10/31/2017  9:35 AM  Resp 20 10/31/2017  9:35 AM  SpO2 94 % 10/31/2017  9:35 AM  Vitals shown include unvalidated device data.  Last Pain:  Vitals:   10/31/17 0928  TempSrc:   PainSc: Asleep      Patients Stated Pain Goal: 1 (10/29/17 1744)  Complications: No apparent anesthesia complications

## 2017-10-31 NOTE — Anesthesia Procedure Notes (Signed)
Procedure Name: Intubation Performed by: Jonna Clark, CRNA Pre-anesthesia Checklist: Patient identified, Patient being monitored, Timeout performed, Emergency Drugs available and Suction available Patient Re-evaluated:Patient Re-evaluated prior to induction Oxygen Delivery Method: Circle system utilized Preoxygenation: Pre-oxygenation with 100% oxygen Induction Type: IV induction and Cricoid Pressure applied Ventilation: Mask ventilation without difficulty Laryngoscope Size: Mac and 4 Grade View: Grade I Tube type: Oral Tube size: 7.5 mm Number of attempts: 1 Airway Equipment and Method: Stylet Placement Confirmation: ETT inserted through vocal cords under direct vision,  positive ETCO2 and breath sounds checked- equal and bilateral Secured at: 21 cm Tube secured with: Tape Dental Injury: Teeth and Oropharynx as per pre-operative assessment

## 2017-10-31 NOTE — Op Note (Signed)
Laparoscopic Cholecystectomy  Pre-operative Diagnosis: Choledocholithiasis, umbilical hernia  Post-operative Diagnosis: Same  Procedure: Helical hernia repair, laparoscopic cholecystectomy with C arm fluoroscopic cholangiography  Surgeon: Adah Salvage. Excell Seltzer, MD FACS  Anesthesia: Gen. with endotracheal tube  Assistant: Vertical tech  Procedure Details  The patient was seen again in the Holding Room. The benefits, complications, treatment options, and expected outcomes were discussed with the patient. The risks of bleeding, infection, recurrence of symptoms, failure to resolve symptoms, bile duct damage, bile duct leak, retained common bile duct stone, bowel injury, any of which could require further surgery and/or ERCP, stent, or papillotomy were reviewed with the patient. The likelihood of improving the patient's symptoms with return to their baseline status is good.  The patient and/or family concurred with the proposed plan, giving informed consent.  The patient was taken to Operating Room, identified as Jerry Sandoval and the procedure verified as Laparoscopic Cholecystectomy.  We also discussed repairing a small umbilical hernia without the use of mesh.  He understood and agreed.  A Time Out was held and the above information confirmed.  Prior to the induction of general anesthesia, antibiotic prophylaxis was administered. VTE prophylaxis was in place. General endotracheal anesthesia was then administered and tolerated well. After the induction, the abdomen was prepped with Chloraprep and draped in the sterile fashion. The patient was positioned in the supine position.  Local anesthetic  was injected into the skin near the umbilicus and an incision made.  Dissection down to the fascia was performed.  The hernia measuring approximately 4 mm was reduced.  Through this hernia was placed a blunt 5 mm trocar sheath.  Pneumoperitoneum was then created with CO2 and tolerated well without any  adverse changes in the patient's vital signs.  The abdominal cavity was explored.  Two 5-mm ports were placed in the right upper quadrant and a 12 mm epigastric port was placed all under direct vision. All skin incisions  were infiltrated with a local anesthetic agent before making the incision and placing the trocars.   The patient was positioned  in reverse Trendelenburg, tilted slightly to the patient's left.  The gallbladder was identified, the fundus grasped and retracted cephalad. Adhesions were lysed bluntly.  Some of these adhesions were dense and attached to an obviously fatty liver.  These adhesions were taken down sharply without the use of energy.  The infundibulum was grasped and retracted laterally, exposing the peritoneum overlying the triangle of Calot. This was then divided and exposed in a blunt fashion.  To aid in this obese patient the 0 degrees scope was changed out to a 30 degree scope.  The cystic lymphatics were doubly clipped and divided.  A critical view of the cystic duct and cystic artery was obtained.  The cystic duct was clearly identified and bluntly dissected.   The cystic artery was doubly clipped and divided.  The cystic duct was then clipped and incised in through a separate incision and Angiocath a cholangiogram catheter was placed.  C-arm fluoroscopic cholangiography demonstrated that a tortuous cystic duct had been cannulated and there was good flow into the duodenum without intraluminal filling defects.  Ghosting of the dilated common hepatic duct was identified.  The patient was placed in Trendelenburg position to aid in filling of the common hepatic duct which promptly evacuated the contrast into the common bile duct distal to the cystic duct.  The proximal radicles were not identified as this contrast promptly and quickly evacuated the proximal system.  It  was clear however that the common hepatic duct was intact and had not been injured or clipped.  The cystic duct  catheter was removed and the cystic duct was triply clipped then divided.  The gallbladder was taken from the gallbladder fossa in a retrograde fashion with the electrocautery. The gallbladder was removed and placed in an Endocatch bag. The liver bed was irrigated and inspected. Hemostasis was achieved with the electrocautery. Copious irrigation was utilized and was repeatedly aspirated until clear.  The gallbladder and Endocatch sac were then removed through the epigastric port site.   A 10 mm JP drain was brought in through a lateral port site and placed into the foramen of Winslow and held in with 3-0 nylon.  Would later be capped and placed to bulb suction.  Inspection of the right upper quadrant was performed. No bleeding, bile duct injury or leak, or bowel injury was noted.   0 Ethibond sutures were placed in the umbilical hernia site and tied as the port  was removed.  There was no sign of bowel injury.  The skin of the umbilicus was tacked back to the fascia with figure-of-eight 3-0 Vicryl's and deep sutures of 3-0 Vicryl were placed.  Pneumoperitoneum was released.  The epigastric port site was closed with figure-of-eight 0 Vicryl sutures. 4-0 subcuticular Monocryl was used to close the skin. Steristrips and Mastisol and sterile dressings were  applied.  The patient was then extubated and brought to the recovery room in stable condition. Sponge, lap, and needle counts were correct at closure and at the conclusion of the case.   Findings: Chronic cholecystitis with no sign of choledocholithiasis.  The liver was quite fatty in nature.  The common bile duct and common hepatic duct appeared dilated.  But no intraluminal filling defects were noted.  Was well visualized and confirmed on cholangiography.  Subcentimeter umbilical hernia  Estimated Blood Loss:         Drains: JP x1 in the foramen of Winslow         Specimens: Gallbladder           Complications: none               Jerry Sandoval  E. Excell Seltzerooper, MD, FACS

## 2017-10-31 NOTE — Progress Notes (Signed)
Preoperative Review   Patient is met in the preoperative holding area. The history is reviewed in the chart and with the patient. I personally reviewed the options and rationale as well as the risks of this procedure that have been previously discussed with the patient. All questions asked by the patient and/or family were answered to their satisfaction.  Patient agrees to proceed with this procedure at this time.  Neville Walston E Patrick Sohm M.D. FACS  

## 2017-10-31 NOTE — Anesthesia Post-op Follow-up Note (Signed)
Anesthesia QCDR form completed.        

## 2017-10-31 NOTE — Progress Notes (Signed)
[facility] - [place] at [facility]   PATIENT NAME: Jerry Sandoval    MR#:  [id number]  DATE OF BIRTH:  1965-03-19  SUBJECTIVE:  CHIEF COMPLAINT:   Chief Complaint  Patient presents with  . Abdominal Pain  Complains of incision tenderness only, underwent lap chole with hernia repair without complication REVIEW OF SYSTEMS:  CONSTITUTIONAL: No fever, fatigue or weakness.  EYES: No blurred or double vision.  EARS, NOSE, AND THROAT: No tinnitus or ear pain.  RESPIRATORY: No cough, shortness of breath, wheezing or hemoptysis.  CARDIOVASCULAR: No chest pain, orthopnea, edema.  GASTROINTESTINAL: No nausea, vomiting, diarrhea or abdominal pain.  GENITOURINARY: No dysuria, hematuria.  ENDOCRINE: No polyuria, nocturia,  HEMATOLOGY: No anemia, easy bruising or bleeding SKIN: No rash or lesion. MUSCULOSKELETAL: No joint pain or arthritis.   NEUROLOGIC: No tingling, numbness, weakness.  PSYCHIATRY: No anxiety or depression.   ROS  DRUG ALLERGIES:  No Known Allergies  VITALS:  Blood pressure 104/73, pulse 99, temperature 98.6 F (37 C), resp. rate 15, height 5\' 10"  (1.778 m), weight (!) 142.4 kg, SpO2 94 %.  PHYSICAL EXAMINATION:  GENERAL:  52 y.o.-year-old patient lying in the bed with no acute distress.  EYES: Pupils equal, round, reactive to light and accommodation. No scleral icterus. Extraocular muscles intact.  HEENT: Head atraumatic, normocephalic. Oropharynx and nasopharynx clear.  NECK:  Supple, no jugular venous distention. No thyroid enlargement, no tenderness.  LUNGS: Normal breath sounds bilaterally, no wheezing, rales,rhonchi or crepitation. No use of accessory muscles of respiration.  CARDIOVASCULAR: S1, S2 normal. No murmurs, rubs, or gallops.  ABDOMEN: Soft, nontender, nondistended. Bowel sounds present. No organomegaly or mass.  EXTREMITIES: No pedal edema, cyanosis, or clubbing.  NEUROLOGIC: Cranial nerves II through XII are intact. Muscle  strength 5/5 in all extremities. Sensation intact. Gait not checked.  PSYCHIATRIC: The patient is alert and oriented x 3.  SKIN: No obvious rash, lesion, or ulcer.   Physical Exam LABORATORY PANEL:   CBC Recent Labs  Lab 10/31/17 0506  WBC 9.6  HGB 15.1  HCT 42.9  PLT 217   ------------------------------------------------------------------------------------------------------------------  Chemistries  Recent Labs  Lab 10/31/17 0506  NA 138  K 3.8  CL 100  CO2 26  GLUCOSE 145*  BUN 11  CREATININE 0.97  CALCIUM 9.2  AST 114*  ALT 76*  ALKPHOS 73  BILITOT 1.3*   ------------------------------------------------------------------------------------------------------------------  Cardiac Enzymes Recent Labs  Lab 10/29/17 1232  TROPONINI <0.03   ------------------------------------------------------------------------------------------------------------------  RADIOLOGY:  Dg Cholangiogram Operative  Result Date: 10/31/2017 CLINICAL DATA:  Intraoperative cholangiogram during laparoscopic cholecystectomy. EXAM: INTRAOPERATIVE CHOLANGIOGRAM FLUOROSCOPY TIME:  44 seconds COMPARISON:  MRCP - 10/29/2017 FINDINGS: Intraoperative cholangiographic images of the right upper abdominal quadrant during laparoscopic cholecystectomy are provided for review. Surgical clips overlie the expected location of the gallbladder fossa. Contrast injection demonstrates selective cannulation of the central aspect of the cystic duct. There is passage of contrast through the central aspect of the cystic duct with filling of a mildly dilated common bile duct. There is passage of contrast though the CBD and into the descending portion of the duodenum. There are no discrete filling defects within the opacified portions of the biliary system to suggest the presence of choledocholithiasis, though the distal most aspect of the CBD was not imaged. IMPRESSION: No definite evidence of choledocholithiasis.  Electronically Signed   By: Simonne Come M.D.   On: 10/31/2017 09:38   Mr Abdomen Mrcp Wo Contrast  Result Date: 10/29/2017 CLINICAL DATA:  Cholelithiasis. Mild common duct dilatation on ultrasound. EXAM: MRI ABDOMEN WITHOUT CONTRAST  (INCLUDING MRCP) TECHNIQUE: Multiplanar multisequence MR imaging of the abdomen was performed. Heavily T2-weighted images of the biliary and pancreatic ducts were obtained, and three-dimensional MRCP images were rendered by post processing. COMPARISON:  Ultrasound of earlier today.  05/11/2016 CT. FINDINGS: Lower chest: Normal heart size without pericardial or pleural effusion. Hepatobiliary: Hepatomegaly at 19.8 cm craniocaudal. Moderate to severe hepatic steatosis. Subcentimeter segment 4 B liver lesion is likely a cyst. Multiple gallstones without specific evidence of acute cholecystitis. No intrahepatic biliary duct dilatation. The common duct measures 6-7 mm, including on image 04/1011. Apparent common duct filling defect on image 8/15 is not confirmed on other pulse sequences, favored to be artifactual. Pancreas:  Normal, without mass or ductal dilatation. Spleen:  Normal in size, without focal abnormality. Adrenals/Urinary Tract: Normal adrenal glands. Bilateral T2 hyperintense renal lesions are likely cysts. Stomach/Bowel: Normal stomach and abdominal bowel loops. Vascular/Lymphatic: Normal caliber of the aorta and branch vessels. No retroperitoneal or retrocrural adenopathy. Other:  No ascites. Musculoskeletal: No acute osseous abnormality. Mild degradation secondary to patient body habitus. IMPRESSION: 1. Cholelithiasis. 2. No biliary duct dilatation or evidence of choledocholithiasis. 3. Hepatic steatosis and hepatomegaly. Electronically Signed   By: Jeronimo GreavesKyle  Talbot M.D.   On: 10/29/2017 22:23   Mr 3d Recon At Scanner  Result Date: 10/29/2017 CLINICAL DATA:  Cholelithiasis. Mild common duct dilatation on ultrasound. EXAM: MRI ABDOMEN WITHOUT CONTRAST  (INCLUDING MRCP)  TECHNIQUE: Multiplanar multisequence MR imaging of the abdomen was performed. Heavily T2-weighted images of the biliary and pancreatic ducts were obtained, and three-dimensional MRCP images were rendered by post processing. COMPARISON:  Ultrasound of earlier today.  05/11/2016 CT. FINDINGS: Lower chest: Normal heart size without pericardial or pleural effusion. Hepatobiliary: Hepatomegaly at 19.8 cm craniocaudal. Moderate to severe hepatic steatosis. Subcentimeter segment 4 B liver lesion is likely a cyst. Multiple gallstones without specific evidence of acute cholecystitis. No intrahepatic biliary duct dilatation. The common duct measures 6-7 mm, including on image 04/1011. Apparent common duct filling defect on image 8/15 is not confirmed on other pulse sequences, favored to be artifactual. Pancreas:  Normal, without mass or ductal dilatation. Spleen:  Normal in size, without focal abnormality. Adrenals/Urinary Tract: Normal adrenal glands. Bilateral T2 hyperintense renal lesions are likely cysts. Stomach/Bowel: Normal stomach and abdominal bowel loops. Vascular/Lymphatic: Normal caliber of the aorta and branch vessels. No retroperitoneal or retrocrural adenopathy. Other:  No ascites. Musculoskeletal: No acute osseous abnormality. Mild degradation secondary to patient body habitus. IMPRESSION: 1. Cholelithiasis. 2. No biliary duct dilatation or evidence of choledocholithiasis. 3. Hepatic steatosis and hepatomegaly. Electronically Signed   By: Jeronimo GreavesKyle  Talbot M.D.   On: 10/29/2017 22:23   [place] Abdomen Limited Ruq  Result Date: 10/29/2017 CLINICAL DATA:  Upper abdominal pain EXAM: ULTRASOUND ABDOMEN LIMITED RIGHT UPPER QUADRANT COMPARISON:  CT abdomen and pelvis May 11, 2016 FINDINGS: Gallbladder: Within the gallbladder, there are echogenic foci which move and shadow consistent with cholelithiasis. Largest gallstone measures 1 cm in length. There is intermittent sludge in the gallbladder as well. There is no  appreciable gallbladder wall thickening or pericholecystic fluid. No sonographic Murphy sign noted by sonographer. Common bile duct: Diameter: 8 mm, mildly dilated. No biliary duct mass or calculus is appreciable on this study. Liver: No focal lesion identified. Liver echogenicity is increased diffusely. Portal vein is patent on color Doppler imaging with normal direction of blood flow towards the liver. IMPRESSION: 1. Cholelithiasis and sludge within the  gallbladder. No appreciable gallbladder wall thickening evident. No pericholecystic fluid. 2. Mild dilatation of the distal common bile duct without mass or calculus appreciable by ultrasound. If further evaluation of the biliary ductal system is felt to be warranted, MRCP from an imaging standpoint would be the optimum study of choice to further evaluate. 3. Diffuse increase in liver echogenicity, a finding felt to be indicative of hepatic steatosis. While no focal liver lesions are evident on this study, it must be cautioned that the sensitivity of ultrasound for detection of focal liver lesions is diminished significantly in this circumstance. Electronically Signed   By: Bretta Bang III M.D.   On: 10/29/2017 11:51    ASSESSMENT AND PLAN:  *Acute abdominal pain Resolved Acute choledocholithiasis ruled out on MRI of the abdomen-noted gallstones with hepatic steatosis As post lap chole/hernia repair October 31, 2017 without complication by Dr. Excell Seltzer   *Chronic morbid obesity Likely due to excess calories Lifestyle Modification recommended  *Chronic diabetes type 2  Stable  Sliding scale insulin with Accu-Cheks per routine  Continue to hold metformin   *Chronic hypertension  Stable on current regiment   *Chronic OSA Continue home CPAP  Disposition to home on tomorrow barring any complications  All the records are reviewed and case discussed with Care Management/Social Workerr. Management plans discussed with the patient,  family and they are in agreement.  CODE STATUS:full  TOTAL TIME TAKING CARE OF THIS PATIENT: 40 minutes.     POSSIBLE D/C IN 1 DAYS, DEPENDING ON CLINICAL CONDITION.   Evelena Asa Karima Carrell M.D on 10/31/2017   Between 7am to 6pm - Pager - [phone]  After 6pm go to [email or web address] - password [email or web address]  [facility]  Office  [phone]  CC: Primary care physician; Danella Penton, MD  Note: This dictation was prepared with Dragon dictation along with smaller phrase technology. Any transcriptional errors that result from this process are unintentional.

## 2017-10-31 NOTE — Anesthesia Postprocedure Evaluation (Signed)
Anesthesia Post Note  Patient: Jerry FootmanChristopher W Sandoval  Procedure(s) Performed: LAPAROSCOPIC CHOLECYSTECTOMY and umbilical hernia repair (N/A )  Patient location during evaluation: PACU Anesthesia Type: General Level of consciousness: awake and alert and oriented Pain management: pain level controlled Vital Signs Assessment: post-procedure vital signs reviewed and stable Respiratory status: spontaneous breathing Cardiovascular status: blood pressure returned to baseline Anesthetic complications: no     Last Vitals:  Vitals:   10/31/17 1045 10/31/17 1211  BP: 104/73 100/69  Pulse: 99 90  Resp: 20 16  Temp:  37 C  SpO2: 94% 95%    Last Pain:  Vitals:   10/31/17 1553  TempSrc:   PainSc: 7                  Polo Mcmartin

## 2017-11-01 ENCOUNTER — Encounter: Payer: Self-pay | Admitting: Surgery

## 2017-11-01 LAB — COMPREHENSIVE METABOLIC PANEL
ALT: 36 U/L (ref 0–44)
AST: 66 U/L — ABNORMAL HIGH (ref 15–41)
Albumin: 4.2 g/dL (ref 3.5–5.0)
Alkaline Phosphatase: 62 U/L (ref 38–126)
Anion gap: 10 (ref 5–15)
BUN: 11 mg/dL (ref 6–20)
CO2: 27 mmol/L (ref 22–32)
Calcium: 9.3 mg/dL (ref 8.9–10.3)
Chloride: 102 mmol/L (ref 98–111)
Creatinine, Ser: 0.99 mg/dL (ref 0.61–1.24)
GFR calc Af Amer: >60 mL/min (ref >60)
GFR calc non Af Amer: >60 mL/min (ref >60)
Glucose, Bld: 146 mg/dL — ABNORMAL HIGH (ref 70–99)
Potassium: 3.9 mmol/L (ref 3.5–5.1)
Sodium: 139 mmol/L (ref 135–145)
Total Bilirubin: 0.9 mg/dL (ref 0.3–1.2)
Total Protein: 7.4 g/dL (ref 6.5–8.1)

## 2017-11-01 LAB — GLUCOSE, CAPILLARY
Glucose-Capillary: 137 mg/dL — ABNORMAL HIGH (ref 70–99)
Glucose-Capillary: 118 mg/dL — ABNORMAL HIGH (ref 70–99)
Glucose-Capillary: 117 mg/dL — ABNORMAL HIGH (ref 70–99)
Glucose-Capillary: 112 mg/dL — ABNORMAL HIGH (ref 70–99)
Glucose-Capillary: 147 mg/dL — ABNORMAL HIGH (ref 70–99)
Glucose-Capillary: 134 mg/dL — ABNORMAL HIGH (ref 70–99)

## 2017-11-01 LAB — CBC WITH DIFFERENTIAL/PLATELET
Basophils Absolute: 0 10*3/uL (ref 0–0.1)
Basophils Relative: 0 %
Eosinophils Absolute: 0.1 10*3/uL (ref 0–0.7)
Eosinophils Relative: 1 %
HCT: 39.8 % — ABNORMAL LOW (ref 40.0–52.0)
Hemoglobin: 13.9 g/dL (ref 13.0–18.0)
Lymphocytes Relative: 15 %
Lymphs Abs: 1.6 10*3/uL (ref 1.0–3.6)
MCH: 32.2 pg (ref 26.0–34.0)
MCHC: 34.9 g/dL (ref 32.0–36.0)
MCV: 92.4 fL (ref 80.0–100.0)
Monocytes Absolute: 0.9 10*3/uL (ref 0.2–1.0)
Monocytes Relative: 8 %
Neutro Abs: 8.4 10*3/uL — ABNORMAL HIGH (ref 1.4–6.5)
Neutrophils Relative %: 76 %
Platelets: 222 10*3/uL (ref 150–440)
RBC: 4.31 MIL/uL — ABNORMAL LOW (ref 4.40–5.90)
RDW: 14 % (ref 11.5–14.5)
WBC: 11 10*3/uL — ABNORMAL HIGH (ref 3.8–10.6)

## 2017-11-01 MED ORDER — OXYCODONE HCL 5 MG PO TABS: 5.0000 mg | ORAL_TABLET | ORAL | 0 refills | Status: DC | PRN

## 2017-11-01 NOTE — Discharge Instructions (Signed)
Remove dressing in 24 hours. May shower in 24 hours. Leave paper strips in place. Resume all home medications. Follow-up with Dr. Excell Seltzerooper in 3 days. Empty and record drain output twice a day.

## 2017-11-01 NOTE — Discharge Summary (Signed)
[facility] - [place] at [facility]   PATIENT NAME: Jerry GurneyChristopher Sandoval    MR#:  [id number]  DATE OF BIRTH:  11/12/1965  DATE OF ADMISSION:  10/29/2017 ADMITTING PHYSICIAN: Auburn BilberryShreyang Patel, MD  DATE OF DISCHARGE: No discharge date for patient encounter.  PRIMARY CARE PHYSICIAN: Danella PentonMiller, Mark F, MD    ADMISSION DIAGNOSIS:  Choledocholithiasis [K80.50] RUQ pain [R10.11] Elevated LFTs [R94.5] Elevated bilirubin [R17]  DISCHARGE DIAGNOSIS:  Active Problems:   Abdominal pain   Cholelithiasis   Choledocholithiasis   Elevated LFTs   Elevated bilirubin   Umbilical hernia without obstruction and without gangrene   SECONDARY DIAGNOSIS:   Past Medical History:  Diagnosis Date  . Arteriovenous malformation of cerebral vessels   . Arthritis   . Asthma   . Carpal tunnel syndrome   . Diabetes mellitus without complication (HCC)   . Hyperlipidemia   . Hypertension   . Sleep apnea     HOSPITAL COURSE:  *Acute abdominal pain Secondary to cholecystitis Resolved Acute choledocholithiasis ruled out on MRI of the abdomen-noted gallstones with hepatic steatosis S/P lap chole October 31, 2017 without complication by Dr. Excell Seltzerooper -to follow-up status post discharge for JP chain removal in 2 to 3 days  *Chronic morbid obesity Stable Likely due to excess calories Lifestyle Modification recommended  *Chronic diabetes type 2 Stable  Sliding scale insulin with Accu-Cheks per routine  Metformin held while in hospital   *Chronic hypertension  Stable on current regiment  *ChronicOSA Continued homeCPAP   DISCHARGE CONDITIONS:   stable  CONSULTS OBTAINED:  Treatment Team:  Lattie Hawooper, Richard E, MD  DRUG ALLERGIES:  No Known Allergies  DISCHARGE MEDICATIONS:   Allergies as of 11/01/2017   No Known Allergies     Medication List    TAKE these medications   albuterol 108 (90 Base) MCG/ACT inhaler Commonly known as:  PROVENTIL HFA;VENTOLIN  HFA Inhale 2 puffs into the lungs every 6 (six) hours as needed for wheezing or shortness of breath.   allopurinol 300 MG tablet Commonly known as:  ZYLOPRIM Take 300 mg by mouth daily.   carbidopa-levodopa 25-100 MG tablet Commonly known as:  SINEMET IR Take 1 tablet by mouth 3 (three) times daily.   CENTRUM SILVER 50+MEN PO Take 1 tablet by mouth daily.   cetirizine 10 MG tablet Commonly known as:  ZYRTEC Take 10 mg by mouth daily.   diazepam 5 MG tablet Commonly known as:  VALIUM Take 5 mg by mouth daily.   glimepiride 2 MG tablet Commonly known as:  AMARYL Take 1 tablet by mouth daily.   indapamide 2.5 MG tablet Commonly known as:  LOZOL TAKE ONE TABLET BY MOUTH EVERY DAY   lisinopril 10 MG tablet Commonly known as:  PRINIVIL,ZESTRIL Take 10 mg by mouth daily.   metFORMIN 500 MG tablet Commonly known as:  GLUCOPHAGE Take 1,000 mg by mouth 2 (two) times daily with a meal.   oxyCODONE 5 MG immediate release tablet Commonly known as:  Oxy IR/ROXICODONE Take 1 tablet (5 mg total) by mouth every 4 (four) hours as needed for moderate pain.        DISCHARGE INSTRUCTIONS:  If you experience worsening of your admission symptoms, develop shortness of breath, life threatening emergency, suicidal or homicidal thoughts you must seek medical attention immediately by calling 911 or calling your MD immediately  if symptoms less severe.  You Must read complete instructions/literature along with all the possible adverse reactions/side effects for all the Medicines you  take and that have been prescribed to you. Take any new Medicines after you have completely understood and accept all the possible adverse reactions/side effects.   Please note  You were cared for by a hospitalist during your hospital stay. If you have any questions about your discharge medications or the care you received while you were in the hospital after you are discharged, you can call the unit and asked to  speak with the hospitalist on call if the hospitalist that took care of you is not available. Once you are discharged, your primary care physician will handle any further medical issues. Please note that NO REFILLS for any discharge medications will be authorized once you are discharged, as it is imperative that you return to your primary care physician (or establish a relationship with a primary care physician if you do not have one) for your aftercare needs so that they can reassess your need for medications and monitor your lab values.    Today   CHIEF COMPLAINT:   Chief Complaint  Patient presents with  . Abdominal Pain    HISTORY OF PRESENT ILLNESS:  52 y.o. male with a known history of diabetes type 2, hypertension, hyperlipidemia and sleep apnea was presented with abdominal pain that started since this morning.  He describes the pain in the right upper quadrant.  Radiating to his back.  He was seen by his primary care provider who ordered a ultrasound of the abdomen and there was concern for quality of cholelithiasis he was referred to ED for admission.  Patient also noticed to have elevated liver function test consistent with quality of cholelithiasis.  He states that he has not had any fevers chills.  He describes the pain as sharp and constant.  No nausea or vomiting.    VITAL SIGNS:  Blood pressure 126/73, pulse (!) 104, temperature 99.5 F (37.5 C), temperature source Oral, resp. rate 20, height 5\' 10"  (1.778 m), weight (!) 142.4 kg, SpO2 95 %.  I/O:    Intake/Output Summary (Last 24 hours) at 11/01/2017 0927 Last data filed at 11/01/2017 0430 Gross per 24 hour  Intake 1965 ml  Output 880 ml  Net 1085 ml    PHYSICAL EXAMINATION:  GENERAL:  52 y.o.-year-old patient lying in the bed with no acute distress.  EYES: Pupils equal, round, reactive to light and accommodation. No scleral icterus. Extraocular muscles intact.  HEENT: Head atraumatic, normocephalic. Oropharynx and  nasopharynx clear.  NECK:  Supple, no jugular venous distention. No thyroid enlargement, no tenderness.  LUNGS: Normal breath sounds bilaterally, no wheezing, rales,rhonchi or crepitation. No use of accessory muscles of respiration.  CARDIOVASCULAR: S1, S2 normal. No murmurs, rubs, or gallops.  ABDOMEN: Soft, non-tender, non-distended. Bowel sounds present. No organomegaly or mass.  EXTREMITIES: No pedal edema, cyanosis, or clubbing.  NEUROLOGIC: Cranial nerves II through XII are intact. Muscle strength 5/5 in all extremities. Sensation intact. Gait not checked.  PSYCHIATRIC: The patient is alert and oriented x 3.  SKIN: No obvious rash, lesion, or ulcer.   DATA REVIEW:   CBC Recent Labs  Lab 11/01/17 0407  WBC 11.0*  HGB 13.9  HCT 39.8*  PLT 222    Chemistries  Recent Labs  Lab 11/01/17 0407  NA 139  K 3.9  CL 102  CO2 27  GLUCOSE 146*  BUN 11  CREATININE 0.99  CALCIUM 9.3  AST 66*  ALT 36  ALKPHOS 62  BILITOT 0.9    Cardiac Enzymes Recent Labs  Lab 10/29/17 1232  TROPONINI <0.03    Microbiology Results  Results for orders placed or performed in visit on 06/12/16  Microscopic Examination     Status: Abnormal   Collection Time: 06/12/16 10:02 AM  Result Value Ref Range Status   WBC, UA 0-5 0 - 5 /hpf Final   RBC, UA 0-2 0 - 2 /hpf Final   Epithelial Cells (non renal) None seen 0 - 10 /hpf Final   Crystals Present (A) N/A Final   Crystal Type Calcium Oxalate N/A Final   Bacteria, UA None seen None seen/Few Final    RADIOLOGY:  Dg Cholangiogram Operative  Result Date: 10/31/2017 CLINICAL DATA:  Intraoperative cholangiogram during laparoscopic cholecystectomy. EXAM: INTRAOPERATIVE CHOLANGIOGRAM FLUOROSCOPY TIME:  44 seconds COMPARISON:  MRCP - 10/29/2017 FINDINGS: Intraoperative cholangiographic images of the right upper abdominal quadrant during laparoscopic cholecystectomy are provided for review. Surgical clips overlie the expected location of the  gallbladder fossa. Contrast injection demonstrates selective cannulation of the central aspect of the cystic duct. There is passage of contrast through the central aspect of the cystic duct with filling of a mildly dilated common bile duct. There is passage of contrast though the CBD and into the descending portion of the duodenum. There are no discrete filling defects within the opacified portions of the biliary system to suggest the presence of choledocholithiasis, though the distal most aspect of the CBD was not imaged. IMPRESSION: No definite evidence of choledocholithiasis. Electronically Signed   By: Simonne Come M.D.   On: 10/31/2017 09:38    EKG:   Orders placed or performed during the hospital encounter of 10/29/17  . ED EKG  . ED EKG      Management plans discussed with the patient, family and they are in agreement.  CODE STATUS:     Code Status Orders  (From admission, onward)         Start     Ordered   10/29/17 1701  Full code  Continuous     10/29/17 1700        Code Status History    Date Active Date Inactive Code Status Order ID Comments User Context   03/23/2016 0446 03/25/2016 1321 Full Code [id number]  Ihor Austin, MD Inpatient      TOTAL TIME TAKING CARE OF THIS PATIENT: 40 minutes.    Evelena Asa Rushton Early M.D on 11/01/2017 at 9:27 AM  Between 7am to 6pm - Pager - [phone]  After 6pm go to [email or web address] - password [email or web address]  [facility]  Office  [phone]  CC: Primary care physician; Danella Penton, MD   Note: This dictation was prepared with Dragon dictation along with smaller phrase technology. Any transcriptional errors that result from this process are unintentional.

## 2017-11-01 NOTE — Progress Notes (Signed)
1 Day Post-Op  Subjective: Patient is postop day 1 from a laparoscopic cholecystectomy with cholangiography and umbilical hernia repair.  He states he is having some incisional pain but is tolerating a diet wants to eat.  He had a bowel movement no nausea or vomiting.  Objective: Vital signs in last 24 hours: Temp:  [97.4 F (36.3 C)-99.5 F (37.5 C)] 99.5 F (37.5 C) (09/16 0539) Pulse Rate:  [90-118] 104 (09/16 0539) Resp:  [13-20] 20 (09/16 0539) BP: (100-128)/(69-84) 126/73 (09/16 0539) SpO2:  [93 %-96 %] 95 % (09/16 0539) Last BM Date: 10/29/17  Intake/Output from previous day: 09/15 0701 - 09/16 0700 In: 1965 [P.O.:865; I.V.:800; IV Piggyback:300] Out: 880 [Urine:800; Drains:70; Blood:10] Intake/Output this shift: Total I/O In: 100 [IV Piggyback:100] Out: 420 [Urine:400; Drains:20]  Physical exam:  Morbidly obese vital signs stable and reviewed. Abdomen is soft but protuberant wounds are clean no erythema no drainage the JP drain has no bile in it.  Lab Results: CBC  Recent Labs    10/31/17 0506 11/01/17 0407  WBC 9.6 11.0*  HGB 15.1 13.9  HCT 42.9 39.8*  PLT 217 222   BMET Recent Labs    10/31/17 0506 11/01/17 0407  NA 138 139  K 3.8 3.9  CL 100 102  CO2 26 27  GLUCOSE 145* 146*  BUN 11 11  CREATININE 0.97 0.99  CALCIUM 9.2 9.3   PT/INR No results for input(s): LABPROT, INR in the last 72 hours. ABG No results for input(s): PHART, HCO3 in the last 72 hours.  Invalid input(s): PCO2, PO2  Studies/Results: Dg Cholangiogram Operative  Result Date: 10/31/2017 CLINICAL DATA:  Intraoperative cholangiogram during laparoscopic cholecystectomy. EXAM: INTRAOPERATIVE CHOLANGIOGRAM FLUOROSCOPY TIME:  44 seconds COMPARISON:  MRCP - 10/29/2017 FINDINGS: Intraoperative cholangiographic images of the right upper abdominal quadrant during laparoscopic cholecystectomy are provided for review. Surgical clips overlie the expected location of the gallbladder fossa.  Contrast injection demonstrates selective cannulation of the central aspect of the cystic duct. There is passage of contrast through the central aspect of the cystic duct with filling of a mildly dilated common bile duct. There is passage of contrast though the CBD and into the descending portion of the duodenum. There are no discrete filling defects within the opacified portions of the biliary system to suggest the presence of choledocholithiasis, though the distal most aspect of the CBD was not imaged. IMPRESSION: No definite evidence of choledocholithiasis. Electronically Signed   By: Simonne ComeJohn  Watts M.D.   On: 10/31/2017 09:38    Anti-infectives: Anti-infectives (From admission, onward)   Start     Dose/Rate Route Frequency Ordered Stop   10/30/17 1500  Ampicillin-Sulbactam (UNASYN) 3 g in sodium chloride 0.9 % 100 mL IVPB     3 g 200 mL/hr over 30 Minutes Intravenous Every 6 hours 10/30/17 1443        Assessment/Plan: s/p Procedure(s): LAPAROSCOPIC CHOLECYSTECTOMY and umbilical hernia repair   Patient doing very well no bile in drain advance diet.  Patient will be able to go home today on oral analgesics.  He will follow-up with me mid week for removal of the JP drain instructions were given.  Lattie Hawichard E Cooper, MD, FACS  11/01/2017

## 2017-11-02 LAB — SURGICAL PATHOLOGY

## 2017-11-04 ENCOUNTER — Ambulatory Visit (INDEPENDENT_AMBULATORY_CARE_PROVIDER_SITE_OTHER): Payer: BLUE CROSS/BLUE SHIELD | Admitting: Surgery

## 2017-11-04 ENCOUNTER — Encounter: Payer: Self-pay | Admitting: Surgery

## 2017-11-04 VITALS — BP 134/72 | Temp 97.8°F | Resp 14 | Ht 71.0 in | Wt 301.0 lb

## 2017-11-04 DIAGNOSIS — K8050 Calculus of bile duct without cholangitis or cholecystitis without obstruction: Secondary | ICD-10-CM

## 2017-11-04 NOTE — Progress Notes (Signed)
Outpatient postop visit  11/04/2017  Jerry FootmanChristopher W Sandoval is an 52 y.o. male.    Procedure: Laparoscopic cholecystectomy with cholangiography and umbilical hernia repair  CC: Minimal right upper quadrant pain  HPI: This is a morbidly obese male patient with a history of choledocholithiasis who underwent a laparoscopic cholecystectomy with cholangiography and an umbilical hernia repair.  He has a drain in place.  He has no nausea vomiting fevers or chills.  Drain is in place with no bile  Medications reviewed.    Physical Exam:  There were no vitals taken for this visit.    PE: Vital signs are stable afebrile  No bile in drain.  Wounds clean no erythema no drainage. No icterus no jaundice   Assessment/Plan:  Patient doing very well drain is removed at this time.  He will follow-up on an as-needed basis.  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  After that he can return to full duty.  This predicated on him not taking narcotics and able to drive as well.  Jerry Hawichard E Cooper, MD, FACS

## 2017-11-19 ENCOUNTER — Telehealth: Payer: Self-pay | Admitting: Licensed Clinical Social Worker

## 2017-11-19 NOTE — Telephone Encounter (Signed)
CSW attempted to call patient to follow up on an EMMI call. There was no answer but CSW left a HIPPA compliant message for him to return call if he needed to. York Spaniel MSW,LCSW [phone]

## 2018-01-19 ENCOUNTER — Ambulatory Visit: Payer: BLUE CROSS/BLUE SHIELD | Admitting: Urology

## 2018-01-31 ENCOUNTER — Other Ambulatory Visit: Payer: Self-pay

## 2018-01-31 ENCOUNTER — Telehealth: Payer: Self-pay | Admitting: Urology

## 2018-01-31 DIAGNOSIS — Z87442 Personal history of urinary calculi: Secondary | ICD-10-CM

## 2018-01-31 DIAGNOSIS — R82994 Hypercalciuria: Secondary | ICD-10-CM

## 2018-01-31 DIAGNOSIS — R82991 Hypocitraturia: Secondary | ICD-10-CM

## 2018-01-31 NOTE — Telephone Encounter (Signed)
Patient is scheduled for a 1-year follow up with a KUB prior on 02/01/18.  The KUB order has expired.  [facility] his appointment on 02/01/18.

## 2018-01-31 NOTE — Telephone Encounter (Signed)
KUB order placed

## 2018-02-01 ENCOUNTER — Ambulatory Visit
Admission: RE | Admit: 2018-02-01 | Discharge: 2018-02-01 | Disposition: A | Discharge status: Home / Self Care | Payer: BLUE CROSS/BLUE SHIELD | Source: Ambulatory Visit | Attending: Urology | Admitting: Urology

## 2018-02-01 ENCOUNTER — Encounter: Payer: Self-pay | Admitting: Urology

## 2018-02-01 ENCOUNTER — Ambulatory Visit: Payer: BLUE CROSS/BLUE SHIELD | Admitting: Urology

## 2018-02-01 ENCOUNTER — Ambulatory Visit
Admission: RE | Admit: 2018-02-01 | Discharge: 2018-02-01 | Disposition: A | Discharge status: Home / Self Care | Payer: BLUE CROSS/BLUE SHIELD | Attending: Urology | Admitting: Urology

## 2018-02-01 VITALS — BP 129/86 | HR 105 | Ht 71.0 in | Wt 311.0 lb

## 2018-02-01 DIAGNOSIS — Z87442 Personal history of urinary calculi: Secondary | ICD-10-CM | POA: Insufficient documentation

## 2018-02-01 DIAGNOSIS — R82991 Hypocitraturia: Secondary | ICD-10-CM | POA: Insufficient documentation

## 2018-02-01 DIAGNOSIS — R82994 Hypercalciuria: Secondary | ICD-10-CM

## 2018-02-01 DIAGNOSIS — N200 Calculus of kidney: Secondary | ICD-10-CM

## 2018-02-01 NOTE — Progress Notes (Signed)
02/01/2018 2:32 PM   Jerry Sandoval 1965/02/19 [id number]  Referring provider: Danella Penton, MD [phone] [facility] [address] [facility] [place], [place] [address]  Chief Complaint  Patient presents with  . Nephrolithiasis    1 yr follow up    HPI: 52 year old male with history of recurrent nephrolithiasis who returns today for routine annual follow-up with KUB.  He is currently managed with indapamide 2.5 mg daily for treatment of his hypercalciuria noted on previous metabolic evaluations.  His is s/p successful ESWL in the past.  KUB today shows a stable stone, measuring approximately 5 mm in the left lower pole.  This is unchanged from a year ago.  No stone attacks over the past year.  No flank pain.  He did have a cholecystectomy earlier this year and is doing very well from this.  He does have personal history of gross hematuria status post cystoscopy and CT urogram 05/2016.   PMH: Past Medical History:  Diagnosis Date  . Arteriovenous malformation of cerebral vessels   . Arthritis   . Asthma   . Carpal tunnel syndrome   . Diabetes mellitus without complication (HCC)   . Hyperlipidemia   . Hypertension   . Sleep apnea     Surgical History: Past Surgical History:  Procedure Laterality Date  . CEREBRAL ANGIOGRAM    . CHOLECYSTECTOMY N/A 10/31/2017   Procedure: LAPAROSCOPIC CHOLECYSTECTOMY and umbilical hernia repair;  Surgeon: Lattie Haw, MD;  Location: [facility] ORS;  Service: General;  Laterality: N/A;  . COLONOSCOPY WITH PROPOFOL N/A 07/09/2015   Procedure: COLONOSCOPY WITH PROPOFOL;  Surgeon: Christena Deem, MD;  Location[facility] ENDOSCOPY;  Service: Endoscopy;  Laterality: N/A;  . EXTRACORPOREAL SHOCK WAVE LITHOTRIPSY Right 06/25/2016   Procedure: EXTRACORPOREAL SHOCK WAVE LITHOTRIPSY (ESWL);  Surgeon: Vanna Scotland, MD;  Location: [facility] ORS;  Service: Urology;  Laterality: Right;  . WISDOM TOOTH EXTRACTION      Home Medications:    Allergies as of 02/01/2018   No Known Allergies     Medication List       Accurate as of February 01, 2018  2:32 PM. Always use your most recent med list.        albuterol 108 (90 Base) MCG/ACT inhaler Commonly known as:  PROVENTIL HFA;VENTOLIN HFA Inhale 2 puffs into the lungs every 6 (six) hours as needed for wheezing or shortness of breath.   allopurinol 300 MG tablet Commonly known as:  ZYLOPRIM Take 300 mg by mouth daily.   carbidopa-levodopa 25-100 MG tablet Commonly known as:  SINEMET IR Take 1 tablet by mouth 3 (three) times daily.   CENTRUM SILVER 50+MEN PO Take 1 tablet by mouth daily.   cetirizine 10 MG tablet Commonly known as:  ZYRTEC Take 10 mg by mouth daily.   glimepiride 2 MG tablet Commonly known as:  AMARYL Take 1 tablet by mouth daily.   indapamide 2.5 MG tablet Commonly known as:  LOZOL TAKE ONE TABLET BY MOUTH EVERY DAY   lisinopril 10 MG tablet Commonly known as:  PRINIVIL,ZESTRIL Take 10 mg by mouth daily.   metFORMIN 500 MG tablet Commonly known as:  GLUCOPHAGE Take 1,000 mg by mouth 2 (two) times daily with a meal.       Allergies: No Known Allergies  Family History: Family History  Problem Relation Age of Onset  . Diabetes Father   . Hypertension Neg Hx   . CAD Neg Hx   . Prostate cancer Neg Hx   .  Bladder Cancer Neg Hx   . Kidney cancer Neg Hx     Social History:  reports that he has quit smoking. His smokeless tobacco use includes chew. He reports that he does not drink alcohol or use drugs.  ROS: UROLOGY Frequent Urination?: No Hard to postpone urination?: No Burning/pain with urination?: No Get up at night to urinate?: No Leakage of urine?: No Urine stream starts and stops?: No Trouble starting stream?: No Do you have to strain to urinate?: No Blood in urine?: No Urinary tract infection?: No Sexually transmitted disease?: No Injury to kidneys or bladder?: No Painful intercourse?: No Weak stream?:  No Erection problems?: No Penile pain?: No  Gastrointestinal Nausea?: No Vomiting?: No Indigestion/heartburn?: No Diarrhea?: No Constipation?: No  Constitutional Fever: No Night sweats?: No Weight loss?: No Fatigue?: No  Skin Skin rash/lesions?: No Itching?: No  Eyes Blurred vision?: No Double vision?: No  Ears/Nose/Throat Sore throat?: No Sinus problems?: No  Hematologic/Lymphatic Swollen glands?: No Easy bruising?: No  Cardiovascular Leg swelling?: No Chest pain?: No  Respiratory Cough?: No Shortness of breath?: No  Endocrine Excessive thirst?: No  Musculoskeletal Back pain?: No Joint pain?: No  Neurological Headaches?: No Dizziness?: No  Psychologic Depression?: No Anxiety?: No  Physical Exam: BP 129/86   Pulse (!) 105   Ht 5\' 11"  (1.803 m)   Wt (!) 311 lb (141.1 kg)   BMI 43.38 kg/m   Constitutional:  Alert and oriented, No acute distress. HEENT: [place] AT, moist mucus membranes.  Trachea midline, no masses. Cardiovascular: No clubbing, cyanosis, or edema. Respiratory: Normal respiratory effort, no increased work of breathing. GI: Abd obese Skin: No rashes, bruises or suspicious lesions. Neurologic: Grossly intact, no focal deficits, moving all 4 extremities. Psychiatric: Normal mood and affect.  Laboratory Data: Lab Results  Component Value Date   WBC 11.0 (H) 11/01/2017   HGB 13.9 11/01/2017   HCT 39.8 (L) 11/01/2017   MCV 92.4 11/01/2017   PLT 222 11/01/2017    Lab Results  Component Value Date   CREATININE 0.99 11/01/2017    Pertinent Imaging: KUB personally reviewed today.  He does have a stable 5 mm left lower pole stone, unchanged from KUB last year when comparison made.  Assessment & Plan:    1. Kidney stones Stable left lower pole stone, 5 mm, asymptomatic He prefer continued surveillance Given no change in 1 year, recommend repeat KUB in 2 years to ensure stability or if he becomes symptomatic Patient is  agreeable this plan  2. Hypercalciuria Continue indapamide 2.5 mg,  no interval changes in his stones or stone episodes this year   Return for recheck in 1 year (no imaging needed).  Vanna ScotlandAshley Dyesha Henault, MD  [facility] [address], [address] [place], [place] [address] [phone]

## 2018-07-21 ENCOUNTER — Encounter
Admission: RE | Admit: 2018-07-21 | Discharge: 2018-07-21 | Disposition: A | Discharge status: Home / Self Care | Payer: BC Managed Care – PPO | Source: Ambulatory Visit | Attending: Orthopedic Surgery | Admitting: Orthopedic Surgery

## 2018-07-21 ENCOUNTER — Other Ambulatory Visit: Payer: Self-pay

## 2018-07-21 DIAGNOSIS — Z01818 Encounter for other preprocedural examination: Secondary | ICD-10-CM | POA: Diagnosis present

## 2018-07-21 DIAGNOSIS — I10 Essential (primary) hypertension: Secondary | ICD-10-CM | POA: Insufficient documentation

## 2018-07-21 HISTORY — DX: Personal history of urinary calculi: Z87.442

## 2018-07-21 HISTORY — DX: Headache, unspecified: R51.9

## 2018-07-21 LAB — URINALYSIS, ROUTINE W REFLEX MICROSCOPIC
Bilirubin Urine: NEGATIVE
Glucose, UA: NEGATIVE mg/dL
Hgb urine dipstick: NEGATIVE
Ketones, ur: NEGATIVE mg/dL
Leukocytes,Ua: NEGATIVE
Nitrite: NEGATIVE
Protein, ur: NEGATIVE mg/dL
Specific Gravity, Urine: 1.017 (ref 1.005–1.030)
pH: 5 (ref 5.0–8.0)

## 2018-07-21 LAB — CBC WITH DIFFERENTIAL/PLATELET
Abs Immature Granulocytes: 0.04 10*3/uL (ref 0.00–0.07)
Basophils Absolute: 0.1 10*3/uL (ref 0.0–0.1)
Basophils Relative: 1 %
Eosinophils Absolute: 0.3 10*3/uL (ref 0.0–0.5)
Eosinophils Relative: 2 %
HCT: 41.9 % (ref 39.0–52.0)
Hemoglobin: 14.2 g/dL (ref 13.0–17.0)
Immature Granulocytes: 0 %
Lymphocytes Relative: 30 %
Lymphs Abs: 3.2 10*3/uL (ref 0.7–4.0)
MCH: 30.8 pg (ref 26.0–34.0)
MCHC: 33.9 g/dL (ref 30.0–36.0)
MCV: 90.9 fL (ref 80.0–100.0)
Monocytes Absolute: 0.8 10*3/uL (ref 0.1–1.0)
Monocytes Relative: 8 %
Neutro Abs: 6.2 10*3/uL (ref 1.7–7.7)
Neutrophils Relative %: 59 %
Platelets: 280 10*3/uL (ref 150–400)
RBC: 4.61 MIL/uL (ref 4.22–5.81)
RDW: 13.3 % (ref 11.5–15.5)
WBC: 10.5 10*3/uL (ref 4.0–10.5)
nRBC: 0 % (ref 0.0–0.2)

## 2018-07-21 LAB — COMPREHENSIVE METABOLIC PANEL
ALT: 40 U/L (ref 0–44)
AST: 34 U/L (ref 15–41)
Albumin: 4.6 g/dL (ref 3.5–5.0)
Alkaline Phosphatase: 62 U/L (ref 38–126)
Anion gap: 14 (ref 5–15)
BUN: 15 mg/dL (ref 6–20)
CO2: 24 mmol/L (ref 22–32)
Calcium: 9.3 mg/dL (ref 8.9–10.3)
Chloride: 99 mmol/L (ref 98–111)
Creatinine, Ser: 0.94 mg/dL (ref 0.61–1.24)
GFR calc Af Amer: >60 mL/min (ref >60)
GFR calc non Af Amer: >60 mL/min (ref >60)
Glucose, Bld: 93 mg/dL (ref 70–99)
Potassium: 3.6 mmol/L (ref 3.5–5.1)
Sodium: 137 mmol/L (ref 135–145)
Total Bilirubin: 0.5 mg/dL (ref 0.3–1.2)
Total Protein: 7.7 g/dL (ref 6.5–8.1)

## 2018-07-21 LAB — HEMOGLOBIN A1C
Hgb A1c MFr Bld: 5.8 % — ABNORMAL HIGH (ref 4.8–5.6)
Mean Plasma Glucose: 119.76 mg/dL

## 2018-07-21 LAB — SURGICAL PCR SCREEN
MRSA, PCR: NEGATIVE
Staphylococcus aureus: NEGATIVE

## 2018-07-21 LAB — SEDIMENTATION RATE: Sed Rate: 18 mm/hr (ref 0–20)

## 2018-07-21 LAB — PROTIME-INR
INR: 1 (ref 0.8–1.2)
Prothrombin Time: 12.9 seconds (ref 11.4–15.2)

## 2018-07-21 LAB — APTT: aPTT: 31 seconds (ref 24–36)

## 2018-07-21 LAB — TYPE AND SCREEN
ABO/RH(D): A POS
Antibody Screen: NEGATIVE

## 2018-07-21 LAB — C-REACTIVE PROTEIN: CRP: 1.9 mg/dL — ABNORMAL HIGH (ref <1.0)

## 2018-07-21 NOTE — Patient Instructions (Addendum)
[facility]: [facility] 2nd floor medical mall [facility](Medical Mall Entrance-take elevator on left to 2nd floor.  Check in with surgery information desk.) To find out your arrival time please call [phone] between 1PM - 3PM on 08-02-18 TUESDAY  Remember: Instructions that are not followed completely may result in serious medical risk, up to and including death, or upon the discretion of your surgeon and anesthesiologist your surgery may need to be rescheduled.    _x___ 1. Do not eat food after midnight the night before your procedure. NO GUM OR CANDY AFTER MIDNIGHT.  You may drink WATER up to 2 hours before you are scheduled to arrive at the hospital for your procedure.  Do not drink WATER  within 2 hours of your scheduled arrival to the hospital.  Type 1 and type 2 diabetics should only drink water.   ____Ensure clear carbohydrate drink on the way to the hospital for bariatric patients  _X___G2 clear carbohydrate drink 3 hours before surgery       __x__ 2. No Alcohol for 24 hours before or after surgery.   __x__3. No Smoking or e-cigarettes for 24 prior to surgery.  Do not use any chewable tobacco products for at least 6 hour prior to surgery   ____  4. Bring all medications with you on the day of surgery if instructed.    __x__ 5. Notify your doctor if there is any change in your medical condition     (cold, fever, infections).    x___6. On the morning of surgery brush your teeth with toothpaste and water.  You may rinse your mouth with mouth wash if you wish.  Do not swallow any toothpaste or mouthwash.   Do not wear jewelry, make-up, hairpins, clips or nail polish.  Do not wear lotions, powders, or perfumes. You may wear deodorant.  Do not shave 48 hours prior to surgery. Men may shave face and neck.  Do not bring valuables to the hospital.    [facility] is not responsible for any belongings or valuables.    Contacts, dentures or bridgework may not be worn into surgery.  Leave your suitcase in the car. After surgery it may be brought to your room.  For patients admitted to the hospital, discharge time is determined by your treatment team.  _  Patients discharged the day of surgery will not be allowed to drive home.  You will need someone to drive you home and stay with you the night of your procedure.    Please read over the following fact sheets that you were given:   [facility] Preparing for Surgery and or MRSA Information/INCENTIVE SPIROMETER  _x___ TAKE THE FOLLOWING MEDICATION THE MORNING OF SURGERY WITH A SMALL SIP OF WATER. These include:  1. SINEMET (CARPIDOPA-LEVODOPA)  2.  3.  4.  5.  6.  ____Fleets enema or Magnesium Citrate as directed.   _x___ Use CHG Soap or sage wipes as directed on instruction sheet   _X___  Bring inhaler to hospital day of surgery-ALBUTEROL INHALER  _X___ Stop Metformin 2 days prior to surgery-LAST DOSE ON  Sunday, June 14TH   ____ Take 1/2 of usual insulin dose the night before surgery and none on the morning surgery.   ____ Follow recommendations from Cardiologist, Pulmonologist or PCP regarding stopping Aspirin, Coumadin, Plavix ,Eliquis, Effient, or Pradaxa, and Pletal.  X____Stop Anti-inflammatories such as Advil, Aleve, Ibuprofen, Motrin, Naproxen, Naprosyn, Goodies powders or aspirin  products 7 DAYS PRIOR TO SURGERY. OK to take Tylenol OR TRAMADOL IF NEEDED   ____ Stop supplements until after surgery.    ____ Bring C-Pap to the hospital.

## 2018-07-22 LAB — URINE CULTURE: Culture: <10000 — AB

## 2018-07-27 DIAGNOSIS — Z6841 Body Mass Index (BMI) 40.0 and over, adult: Secondary | ICD-10-CM | POA: Insufficient documentation

## 2018-07-29 ENCOUNTER — Other Ambulatory Visit: Payer: Self-pay

## 2018-07-29 ENCOUNTER — Other Ambulatory Visit
Admission: RE | Admit: 2018-07-29 | Discharge: 2018-07-29 | Disposition: A | Discharge status: Home / Self Care | Payer: BC Managed Care – PPO | Source: Ambulatory Visit | Attending: Orthopedic Surgery | Admitting: Orthopedic Surgery

## 2018-07-29 DIAGNOSIS — Z1159 Encounter for screening for other viral diseases: Secondary | ICD-10-CM | POA: Diagnosis not present

## 2018-07-29 DIAGNOSIS — Z01812 Encounter for preprocedural laboratory examination: Secondary | ICD-10-CM | POA: Diagnosis present

## 2018-07-30 LAB — NOVEL CORONAVIRUS, NAA (HOSP ORDER, SEND-OUT TO REF LAB; TAT 18-24 HRS): SARS-CoV-2, NAA: NOT DETECTED

## 2018-08-02 ENCOUNTER — Encounter: Payer: Self-pay | Admitting: *Deleted

## 2018-08-02 MED ORDER — TRANEXAMIC ACID-NACL 1000-0.7 MG/100ML-% IV SOLN
1000.0000 mg | INTRAVENOUS | Status: DC
  Filled 2018-08-02: qty 100

## 2018-08-02 MED ORDER — DEXTROSE 5 % IV SOLN
3.0000 g | INTRAVENOUS | Status: DC
  Filled 2018-08-02: qty 3000

## 2018-08-03 ENCOUNTER — Inpatient Hospital Stay: Payer: BC Managed Care – PPO | Admitting: Anesthesiology

## 2018-08-03 ENCOUNTER — Encounter: Payer: Self-pay | Admitting: Orthopedic Surgery

## 2018-08-03 ENCOUNTER — Other Ambulatory Visit: Payer: Self-pay

## 2018-08-03 ENCOUNTER — Inpatient Hospital Stay: Payer: BC Managed Care – PPO

## 2018-08-03 ENCOUNTER — Encounter
Admission: RE | Disposition: A | Discharge status: Home Health | Payer: Self-pay | Source: Ambulatory Visit | Attending: Orthopedic Surgery

## 2018-08-03 ENCOUNTER — Inpatient Hospital Stay
Admission: RE | Admit: 2018-08-03 | Discharge: 2018-08-05 | DRG: 470 | Disposition: A | Discharge status: Home Health | Payer: BC Managed Care – PPO | Source: Ambulatory Visit | Attending: Orthopedic Surgery | Admitting: Orthopedic Surgery

## 2018-08-03 DIAGNOSIS — Z1159 Encounter for screening for other viral diseases: Secondary | ICD-10-CM | POA: Diagnosis not present

## 2018-08-03 DIAGNOSIS — I10 Essential (primary) hypertension: Secondary | ICD-10-CM | POA: Diagnosis present

## 2018-08-03 DIAGNOSIS — Z6841 Body Mass Index (BMI) 40.0 and over, adult: Secondary | ICD-10-CM | POA: Diagnosis not present

## 2018-08-03 DIAGNOSIS — Z96649 Presence of unspecified artificial hip joint: Secondary | ICD-10-CM

## 2018-08-03 DIAGNOSIS — E119 Type 2 diabetes mellitus without complications: Secondary | ICD-10-CM | POA: Diagnosis present

## 2018-08-03 DIAGNOSIS — E785 Hyperlipidemia, unspecified: Secondary | ICD-10-CM | POA: Diagnosis present

## 2018-08-03 DIAGNOSIS — G4730 Sleep apnea, unspecified: Secondary | ICD-10-CM | POA: Diagnosis present

## 2018-08-03 DIAGNOSIS — M1612 Unilateral primary osteoarthritis, left hip: Principal | ICD-10-CM | POA: Diagnosis present

## 2018-08-03 DIAGNOSIS — Z7984 Long term (current) use of oral hypoglycemic drugs: Secondary | ICD-10-CM

## 2018-08-03 HISTORY — PX: TOTAL HIP ARTHROPLASTY: SHX124

## 2018-08-03 HISTORY — DX: Tremor, unspecified: R25.1

## 2018-08-03 LAB — GLUCOSE, CAPILLARY
Glucose-Capillary: 101 mg/dL — ABNORMAL HIGH (ref 70–99)
Glucose-Capillary: 158 mg/dL — ABNORMAL HIGH (ref 70–99)
Glucose-Capillary: 159 mg/dL — ABNORMAL HIGH (ref 70–99)
Glucose-Capillary: 229 mg/dL — ABNORMAL HIGH (ref 70–99)

## 2018-08-03 LAB — ABO/RH: ABO/RH(D): A POS

## 2018-08-03 SURGERY — ARTHROPLASTY, HIP, TOTAL,POSTERIOR APPROACH
Anesthesia: Choice | Laterality: Left

## 2018-08-03 MED ORDER — ENOXAPARIN SODIUM 30 MG/0.3ML ~~LOC~~ SOLN
30.0000 mg | Freq: Two times a day (BID) | SUBCUTANEOUS | Status: DC
  Administered 2018-08-04 – 2018-08-05 (×3): 30 mg via SUBCUTANEOUS
  Filled 2018-08-03 (×3): qty 0.3

## 2018-08-03 MED ORDER — ALBUTEROL SULFATE (2.5 MG/3ML) 0.083% IN NEBU: 3.0000 mL | INHALATION_SOLUTION | Freq: Four times a day (QID) | RESPIRATORY_TRACT | Status: DC | PRN

## 2018-08-03 MED ORDER — FENTANYL CITRATE (PF) 100 MCG/2ML IJ SOLN
INTRAMUSCULAR | Status: AC
  Filled 2018-08-03: qty 2

## 2018-08-03 MED ORDER — DEXTROSE 5 % IV SOLN
INTRAVENOUS | Status: DC | PRN
  Administered 2018-08-03: 2 g via INTRAVENOUS
  Administered 2018-08-03: 09:00:00 3 g via INTRAVENOUS

## 2018-08-03 MED ORDER — ACETAMINOPHEN 10 MG/ML IV SOLN
INTRAVENOUS | Status: AC
  Filled 2018-08-03: qty 100

## 2018-08-03 MED ORDER — DEXAMETHASONE SODIUM PHOSPHATE 10 MG/ML IJ SOLN
8.0000 mg | Freq: Once | INTRAMUSCULAR | Status: AC
  Administered 2018-08-03: 08:00:00 8 mg via INTRAVENOUS

## 2018-08-03 MED ORDER — LISINOPRIL 10 MG PO TABS
10.0000 mg | ORAL_TABLET | ORAL | Status: DC
  Administered 2018-08-04: 06:00:00 10 mg via ORAL
  Filled 2018-08-03 (×2): qty 1

## 2018-08-03 MED ORDER — ACETAMINOPHEN 325 MG PO TABS
325.0000 mg | ORAL_TABLET | Freq: Four times a day (QID) | ORAL | Status: DC | PRN
  Administered 2018-08-04 – 2018-08-05 (×2): 650 mg via ORAL
  Filled 2018-08-03 (×2): qty 2

## 2018-08-03 MED ORDER — PROPOFOL 500 MG/50ML IV EMUL
INTRAVENOUS | Status: DC | PRN
  Administered 2018-08-03: 100 ug/kg/min via INTRAVENOUS

## 2018-08-03 MED ORDER — METOCLOPRAMIDE HCL 10 MG PO TABS: 5.0000 mg | ORAL_TABLET | Freq: Three times a day (TID) | ORAL | Status: DC | PRN

## 2018-08-03 MED ORDER — INSULIN ASPART 100 UNIT/ML ~~LOC~~ SOLN
0.0000 [IU] | Freq: Three times a day (TID) | SUBCUTANEOUS | Status: DC
  Administered 2018-08-03: 3 [IU] via SUBCUTANEOUS
  Administered 2018-08-04 – 2018-08-05 (×3): 2 [IU] via SUBCUTANEOUS
  Filled 2018-08-03 (×4): qty 1

## 2018-08-03 MED ORDER — TRANEXAMIC ACID-NACL 1000-0.7 MG/100ML-% IV SOLN
INTRAVENOUS | Status: DC | PRN
  Administered 2018-08-03: 1000 mg via INTRAVENOUS

## 2018-08-03 MED ORDER — ONDANSETRON HCL 4 MG/2ML IJ SOLN: 4.0000 mg | Freq: Once | INTRAMUSCULAR | Status: DC | PRN

## 2018-08-03 MED ORDER — PANTOPRAZOLE SODIUM 40 MG PO TBEC
40.0000 mg | DELAYED_RELEASE_TABLET | Freq: Two times a day (BID) | ORAL | Status: DC
  Administered 2018-08-03 – 2018-08-05 (×4): 40 mg via ORAL
  Filled 2018-08-03 (×4): qty 1

## 2018-08-03 MED ORDER — CARBIDOPA-LEVODOPA ER 50-200 MG PO TBCR
1.0000 | EXTENDED_RELEASE_TABLET | Freq: Two times a day (BID) | ORAL | Status: DC
  Administered 2018-08-03 – 2018-08-05 (×4): 1 via ORAL
  Filled 2018-08-03 (×5): qty 1

## 2018-08-03 MED ORDER — ACETAMINOPHEN 10 MG/ML IV SOLN
INTRAVENOUS | Status: DC | PRN
  Administered 2018-08-03: 1000 mg via INTRAVENOUS

## 2018-08-03 MED ORDER — SENNOSIDES-DOCUSATE SODIUM 8.6-50 MG PO TABS
1.0000 | ORAL_TABLET | Freq: Two times a day (BID) | ORAL | Status: DC
  Administered 2018-08-03 – 2018-08-05 (×4): 1 via ORAL
  Filled 2018-08-03 (×4): qty 1

## 2018-08-03 MED ORDER — METOCLOPRAMIDE HCL 10 MG PO TABS
10.0000 mg | ORAL_TABLET | Freq: Three times a day (TID) | ORAL | Status: AC
  Administered 2018-08-03 – 2018-08-05 (×8): 10 mg via ORAL
  Filled 2018-08-03 (×8): qty 1

## 2018-08-03 MED ORDER — PROPOFOL 10 MG/ML IV BOLUS
INTRAVENOUS | Status: DC | PRN
  Administered 2018-08-03: 30 mg via INTRAVENOUS
  Administered 2018-08-03: 200 mg via INTRAVENOUS

## 2018-08-03 MED ORDER — CEFAZOLIN SODIUM-DEXTROSE 2-4 GM/100ML-% IV SOLN
2.0000 g | Freq: Four times a day (QID) | INTRAVENOUS | Status: DC
  Filled 2018-08-03 (×3): qty 100

## 2018-08-03 MED ORDER — DIPHENHYDRAMINE HCL 12.5 MG/5ML PO ELIX: 12.5000 mg | ORAL_SOLUTION | ORAL | Status: DC | PRN

## 2018-08-03 MED ORDER — HYDROMORPHONE HCL 1 MG/ML IJ SOLN: 0.5000 mg | INTRAMUSCULAR | Status: DC | PRN

## 2018-08-03 MED ORDER — CELECOXIB 200 MG PO CAPS
200.0000 mg | ORAL_CAPSULE | Freq: Two times a day (BID) | ORAL | Status: DC
  Administered 2018-08-03 – 2018-08-05 (×4): 200 mg via ORAL
  Filled 2018-08-03 (×4): qty 1

## 2018-08-03 MED ORDER — FENTANYL CITRATE (PF) 100 MCG/2ML IJ SOLN
25.0000 ug | INTRAMUSCULAR | Status: AC | PRN
  Administered 2018-08-03 (×6): 25 ug via INTRAVENOUS

## 2018-08-03 MED ORDER — TRANEXAMIC ACID-NACL 1000-0.7 MG/100ML-% IV SOLN
1000.0000 mg | Freq: Once | INTRAVENOUS | Status: AC
  Administered 2018-08-03 (×2): 1000 mg via INTRAVENOUS
  Filled 2018-08-03: qty 100

## 2018-08-03 MED ORDER — MIDAZOLAM HCL 2 MG/2ML IJ SOLN
INTRAMUSCULAR | Status: AC
  Filled 2018-08-03: qty 2

## 2018-08-03 MED ORDER — BISACODYL 10 MG RE SUPP: 10.0000 mg | Freq: Every day | RECTAL | Status: DC | PRN

## 2018-08-03 MED ORDER — FAMOTIDINE 20 MG PO TABS
20.0000 mg | ORAL_TABLET | Freq: Once | ORAL | Status: AC
  Administered 2018-08-03: 07:00:00 20 mg via ORAL

## 2018-08-03 MED ORDER — METFORMIN HCL 500 MG PO TABS
1000.0000 mg | ORAL_TABLET | Freq: Two times a day (BID) | ORAL | Status: DC
  Administered 2018-08-03 – 2018-08-05 (×4): 1000 mg via ORAL
  Filled 2018-08-03 (×4): qty 2

## 2018-08-03 MED ORDER — MAGNESIUM HYDROXIDE 400 MG/5ML PO SUSP
30.0000 mL | Freq: Every day | ORAL | Status: DC
  Administered 2018-08-03: 15:00:00 30 mL via ORAL
  Filled 2018-08-03: qty 30

## 2018-08-03 MED ORDER — GABAPENTIN 300 MG PO CAPS
300.0000 mg | ORAL_CAPSULE | Freq: Once | ORAL | Status: AC
  Administered 2018-08-03: 07:00:00 300 mg via ORAL

## 2018-08-03 MED ORDER — METOCLOPRAMIDE HCL 5 MG/ML IJ SOLN: 5.0000 mg | Freq: Three times a day (TID) | INTRAMUSCULAR | Status: DC | PRN

## 2018-08-03 MED ORDER — ALLOPURINOL 300 MG PO TABS
300.0000 mg | ORAL_TABLET | Freq: Every evening | ORAL | Status: DC
  Administered 2018-08-03 – 2018-08-04 (×2): 300 mg via ORAL
  Filled 2018-08-03 (×3): qty 1

## 2018-08-03 MED ORDER — CEFAZOLIN SODIUM-DEXTROSE 2-4 GM/100ML-% IV SOLN
2.0000 g | Freq: Four times a day (QID) | INTRAVENOUS | Status: AC
  Administered 2018-08-03 – 2018-08-04 (×3): 2 g via INTRAVENOUS
  Filled 2018-08-03 (×3): qty 100

## 2018-08-03 MED ORDER — DEXAMETHASONE SODIUM PHOSPHATE 10 MG/ML IJ SOLN
INTRAMUSCULAR | Status: AC
  Filled 2018-08-03: qty 1

## 2018-08-03 MED ORDER — FERROUS SULFATE 325 (65 FE) MG PO TABS
325.0000 mg | ORAL_TABLET | Freq: Two times a day (BID) | ORAL | Status: DC
  Administered 2018-08-03 – 2018-08-05 (×4): 325 mg via ORAL
  Filled 2018-08-03 (×4): qty 1

## 2018-08-03 MED ORDER — ONDANSETRON HCL 4 MG PO TABS: 4.0000 mg | ORAL_TABLET | Freq: Four times a day (QID) | ORAL | Status: DC | PRN

## 2018-08-03 MED ORDER — PROPOFOL 10 MG/ML IV BOLUS
INTRAVENOUS | Status: AC
  Filled 2018-08-03: qty 20

## 2018-08-03 MED ORDER — LORATADINE 10 MG PO TABS
10.0000 mg | ORAL_TABLET | Freq: Every day | ORAL | Status: DC
  Administered 2018-08-04 – 2018-08-05 (×2): 10 mg via ORAL
  Filled 2018-08-03 (×2): qty 1

## 2018-08-03 MED ORDER — ONDANSETRON HCL 4 MG/2ML IJ SOLN
INTRAMUSCULAR | Status: DC | PRN
  Administered 2018-08-03: 4 mg via INTRAVENOUS

## 2018-08-03 MED ORDER — BUPIVACAINE HCL (PF) 0.5 % IJ SOLN
INTRAMUSCULAR | Status: AC
  Filled 2018-08-03: qty 10

## 2018-08-03 MED ORDER — SODIUM CHLORIDE 0.9 % IV SOLN
INTRAVENOUS | Status: DC
  Administered 2018-08-03 – 2018-08-04 (×2): via INTRAVENOUS

## 2018-08-03 MED ORDER — DEXMEDETOMIDINE HCL IN NACL 80 MCG/20ML IV SOLN
INTRAVENOUS | Status: AC
  Filled 2018-08-03: qty 20

## 2018-08-03 MED ORDER — TRAMADOL HCL 50 MG PO TABS
50.0000 mg | ORAL_TABLET | ORAL | Status: DC | PRN
  Administered 2018-08-03: 100 mg via ORAL
  Filled 2018-08-03: qty 2

## 2018-08-03 MED ORDER — CELECOXIB 200 MG PO CAPS
ORAL_CAPSULE | ORAL | Status: AC
  Administered 2018-08-03: 07:00:00 400 mg via ORAL
  Filled 2018-08-03: qty 2

## 2018-08-03 MED ORDER — FAMOTIDINE 20 MG PO TABS
ORAL_TABLET | ORAL | Status: AC
  Administered 2018-08-03: 20 mg via ORAL
  Filled 2018-08-03: qty 1

## 2018-08-03 MED ORDER — ONDANSETRON HCL 4 MG/2ML IJ SOLN: 4.0000 mg | Freq: Four times a day (QID) | INTRAMUSCULAR | Status: DC | PRN

## 2018-08-03 MED ORDER — SUCCINYLCHOLINE CHLORIDE 20 MG/ML IJ SOLN
INTRAMUSCULAR | Status: DC | PRN
  Administered 2018-08-03 (×2): 120 mg via INTRAVENOUS

## 2018-08-03 MED ORDER — DEXMEDETOMIDINE HCL 200 MCG/2ML IV SOLN
INTRAVENOUS | Status: DC | PRN
  Administered 2018-08-03 (×2): 12 ug via INTRAVENOUS

## 2018-08-03 MED ORDER — OXYCODONE HCL 5 MG PO TABS
10.0000 mg | ORAL_TABLET | ORAL | Status: DC | PRN
  Administered 2018-08-03 – 2018-08-05 (×5): 10 mg via ORAL
  Filled 2018-08-03 (×6): qty 2

## 2018-08-03 MED ORDER — FENTANYL CITRATE (PF) 100 MCG/2ML IJ SOLN
INTRAMUSCULAR | Status: DC | PRN
  Administered 2018-08-03: 50 ug via INTRAVENOUS
  Administered 2018-08-03: 25 ug via INTRAVENOUS
  Administered 2018-08-03: 50 ug via INTRAVENOUS
  Administered 2018-08-03: 25 ug via INTRAVENOUS
  Administered 2018-08-03: 50 ug via INTRAVENOUS

## 2018-08-03 MED ORDER — GABAPENTIN 300 MG PO CAPS
ORAL_CAPSULE | ORAL | Status: AC
  Administered 2018-08-03: 300 mg via ORAL
  Filled 2018-08-03: qty 1

## 2018-08-03 MED ORDER — FLEET ENEMA 7-19 GM/118ML RE ENEM: 1.0000 | ENEMA | Freq: Once | RECTAL | Status: DC | PRN

## 2018-08-03 MED ORDER — EPHEDRINE SULFATE 50 MG/ML IJ SOLN
INTRAMUSCULAR | Status: AC
  Filled 2018-08-03: qty 1

## 2018-08-03 MED ORDER — PHENOL 1.4 % MT LIQD
1.0000 | OROMUCOSAL | Status: DC | PRN
  Filled 2018-08-03 (×2): qty 177

## 2018-08-03 MED ORDER — NEOMYCIN-POLYMYXIN B GU 40-200000 IR SOLN
Status: AC
  Filled 2018-08-03: qty 20

## 2018-08-03 MED ORDER — CELECOXIB 200 MG PO CAPS
400.0000 mg | ORAL_CAPSULE | Freq: Once | ORAL | Status: AC
  Administered 2018-08-03: 07:00:00 400 mg via ORAL

## 2018-08-03 MED ORDER — PROPOFOL 500 MG/50ML IV EMUL
INTRAVENOUS | Status: AC
  Filled 2018-08-03: qty 50

## 2018-08-03 MED ORDER — ACETAMINOPHEN 10 MG/ML IV SOLN
1000.0000 mg | Freq: Four times a day (QID) | INTRAVENOUS | Status: AC
  Administered 2018-08-03 – 2018-08-04 (×4): 1000 mg via INTRAVENOUS
  Filled 2018-08-03 (×4): qty 100

## 2018-08-03 MED ORDER — NEOMYCIN-POLYMYXIN B GU 40-200000 IR SOLN
Status: DC | PRN
  Administered 2018-08-03: 4 mL
  Administered 2018-08-03: 2 mL

## 2018-08-03 MED ORDER — SODIUM CHLORIDE 0.9 % IV SOLN
INTRAVENOUS | Status: DC
  Administered 2018-08-03 (×2): via INTRAVENOUS

## 2018-08-03 MED ORDER — BUPIVACAINE HCL (PF) 0.5 % IJ SOLN
INTRAMUSCULAR | Status: DC | PRN
  Administered 2018-08-03: 15 mL

## 2018-08-03 MED ORDER — MIDAZOLAM HCL 5 MG/5ML IJ SOLN
INTRAMUSCULAR | Status: DC | PRN
  Administered 2018-08-03 (×4): 2 mg via INTRAVENOUS

## 2018-08-03 MED ORDER — ALUM & MAG HYDROXIDE-SIMETH 200-200-20 MG/5ML PO SUSP: 30.0000 mL | ORAL | Status: DC | PRN

## 2018-08-03 MED ORDER — CHLORHEXIDINE GLUCONATE 4 % EX LIQD: 60.0000 mL | Freq: Once | CUTANEOUS | Status: DC

## 2018-08-03 MED ORDER — GABAPENTIN 300 MG PO CAPS
300.0000 mg | ORAL_CAPSULE | Freq: Every day | ORAL | Status: DC
  Administered 2018-08-03 – 2018-08-04 (×2): 300 mg via ORAL
  Filled 2018-08-03 (×2): qty 1

## 2018-08-03 MED ORDER — ADULT MULTIVITAMIN W/MINERALS CH
1.0000 | ORAL_TABLET | Freq: Every day | ORAL | Status: DC
  Administered 2018-08-04 – 2018-08-05 (×2): 1 via ORAL
  Filled 2018-08-03 (×2): qty 1

## 2018-08-03 MED ORDER — PHENYLEPHRINE HCL (PRESSORS) 10 MG/ML IV SOLN
INTRAVENOUS | Status: AC
  Filled 2018-08-03: qty 1

## 2018-08-03 MED ORDER — INDAPAMIDE 2.5 MG PO TABS
2.5000 mg | ORAL_TABLET | Freq: Every day | ORAL | Status: DC
  Administered 2018-08-03: 15:00:00 2.5 mg via ORAL
  Filled 2018-08-03 (×3): qty 1

## 2018-08-03 MED ORDER — OXYCODONE HCL 5 MG PO TABS: 5.0000 mg | ORAL_TABLET | ORAL | Status: DC | PRN

## 2018-08-03 MED ORDER — GLIMEPIRIDE 2 MG PO TABS
2.0000 mg | ORAL_TABLET | Freq: Every day | ORAL | Status: DC
  Administered 2018-08-03 – 2018-08-05 (×3): 2 mg via ORAL
  Filled 2018-08-03 (×3): qty 1

## 2018-08-03 MED ORDER — MENTHOL 3 MG MT LOZG
1.0000 | LOZENGE | OROMUCOSAL | Status: DC | PRN
  Filled 2018-08-03 (×2): qty 9

## 2018-08-03 SURGICAL SUPPLY — 55 items
BLADE DRUM FLTD (BLADE) ×2 IMPLANT
BLADE SAW 90X25X1.19 OSCILLAT (BLADE) ×2 IMPLANT
CANISTER SUCT 1200ML W/VALVE (MISCELLANEOUS) ×2 IMPLANT
CANISTER SUCT 3000ML PPV (MISCELLANEOUS) ×4 IMPLANT
CARTRIDGE OIL MAESTRO DRILL (MISCELLANEOUS) ×1 IMPLANT
COVER WAND RF STERILE (DRAPES) ×2 IMPLANT
CUP ACETBLR 54 OD 100 SERIES (Hips) ×2 IMPLANT
DIFFUSER DRILL AIR PNEUMATIC (MISCELLANEOUS) ×2 IMPLANT
DRAPE INCISE IOBAN 66X60 STRL (DRAPES) ×2 IMPLANT
DRAPE SHEET LG 3/4 BI-LAMINATE (DRAPES) ×2 IMPLANT
DRSG DERMACEA 8X12 NADH (GAUZE/BANDAGES/DRESSINGS) ×2 IMPLANT
DRSG OPSITE POSTOP 4X12 (GAUZE/BANDAGES/DRESSINGS) IMPLANT
DRSG OPSITE POSTOP 4X14 (GAUZE/BANDAGES/DRESSINGS) ×2 IMPLANT
DRSG TEGADERM 4X4.75 (GAUZE/BANDAGES/DRESSINGS) ×2 IMPLANT
DURAPREP 26ML APPLICATOR (WOUND CARE) ×2 IMPLANT
GLOVE BIOGEL M STRL SZ7.5 (GLOVE) ×4 IMPLANT
GLOVE INDICATOR 8.0 STRL GRN (GLOVE) ×2 IMPLANT
GOWN STRL REUS W/ TWL LRG LVL3 (GOWN DISPOSABLE) ×2 IMPLANT
GOWN STRL REUS W/TWL LRG LVL3 (GOWN DISPOSABLE) ×2
HEAD M SROM 36MM PLUS 1.5 (Hips) ×1 IMPLANT
HEMOVAC 400CC 10FR (MISCELLANEOUS) ×2 IMPLANT
HOLDER FOLEY CATH W/STRAP (MISCELLANEOUS) ×2 IMPLANT
HOOD PEEL AWAY FLYTE STAYCOOL (MISCELLANEOUS) ×4 IMPLANT
KIT TURNOVER KIT A (KITS) ×2 IMPLANT
LINER MARATHON 10D 36X54 (Hips) ×1 IMPLANT
LINER MARATHON 10DEG 36X54 (Hips) ×1 IMPLANT
MANIFOLD NEPTUNE WASTE (CANNULA) ×2 IMPLANT
NDL SAFETY ECLIPSE 18X1.5 (NEEDLE) ×1 IMPLANT
NEEDLE HYPO 18GX1.5 SHARP (NEEDLE) ×1
NS IRRIG 500ML POUR BTL (IV SOLUTION) ×2 IMPLANT
OIL CARTRIDGE MAESTRO DRILL (MISCELLANEOUS) ×2
PACK HIP PROSTHESIS (MISCELLANEOUS) ×2 IMPLANT
PENCIL SMOKE ULTRAEVAC 22 CON (MISCELLANEOUS) ×2 IMPLANT
PIN STEIN THRED 5/32 (Pin) ×2 IMPLANT
PULSAVAC PLUS IRRIG FAN TIP (DISPOSABLE) ×2
SOL .9 NS 3000ML IRR  AL (IV SOLUTION) ×1
SOL .9 NS 3000ML IRR UROMATIC (IV SOLUTION) ×1 IMPLANT
SOL PREP PVP 2OZ (MISCELLANEOUS) ×2
SOLUTION PREP PVP 2OZ (MISCELLANEOUS) ×1 IMPLANT
SPONGE DRAIN TRACH 4X4 STRL 2S (GAUZE/BANDAGES/DRESSINGS) ×2 IMPLANT
SPONGE LAP 18X18 RF (DISPOSABLE) ×2 IMPLANT
SROM M HEAD 36MM PLUS 1.5 (Hips) ×2 IMPLANT
STAPLER SKIN PROX 35W (STAPLE) ×2 IMPLANT
STEM AML 12X155X30 STD SM 6IN (Joint) ×2 IMPLANT
SUT ETHIBOND #5 BRAIDED 30INL (SUTURE) ×2 IMPLANT
SUT VIC AB 0 CT1 36 (SUTURE) ×4 IMPLANT
SUT VIC AB 1 CT1 36 (SUTURE) ×4 IMPLANT
SUT VIC AB 2-0 CT1 27 (SUTURE) ×1
SUT VIC AB 2-0 CT1 TAPERPNT 27 (SUTURE) ×1 IMPLANT
SYR 20CC LL (SYRINGE) ×2 IMPLANT
TAPE CLOTH 3X10 WHT NS LF (GAUZE/BANDAGES/DRESSINGS) ×2 IMPLANT
TAPE TRANSPORE STRL 2 31045 (GAUZE/BANDAGES/DRESSINGS) ×2 IMPLANT
TIP FAN IRRIG PULSAVAC PLUS (DISPOSABLE) ×1 IMPLANT
TOWEL OR 17X26 4PK STRL BLUE (TOWEL DISPOSABLE) ×2 IMPLANT
TRAY FOLEY MTR SLVR 16FR STAT (SET/KITS/TRAYS/PACK) ×2 IMPLANT

## 2018-08-03 NOTE — TOC Initial Note (Signed)
Transition of Care [facility]) - Initial/Assessment Note    Patient Details  Name: Jerry Sandoval MRN: [id number] Date of Birth: 08/03/1965  [facility]) CM/SW Contact:    Su Hilt, RN Phone Number: 08/03/2018, 2:54 PM  Clinical Narrative:                 Met with the patient to discuss DC plan and needs He lives at home with his wife, He has transportation when he cant drive with his mom, He goes to Dr Sabra Heck PCP and is up to date on his visits [facility] . and can afford his meds with no problem, I explained to him that I have requested his lovenox price and would notify him once obtained, he stated understanding He has DME at home including [place], [place], RW, high rise toilet and feels that he does not need anything additional.  [facility] was set up at the surgeons office prior to Surgery and the patient agrees to accept them for services.  I notified Helene Kelp that the patient accepted.  Helene Kelp agreed to see the patient. NO further needs at this time, will continue to monitor for any additional needs  Expected Discharge Plan: [place] Barriers to Discharge: Continued Medical Work up   Patient Goals and CMS Choice Patient states their goals for this hospitalization and ongoing recovery are:: go home with wife CMS Medicare.gov Compare Post Acute Care list provided to:: Patient Choice offered to / list presented to : Patient  Expected Discharge Plan and Services Expected Discharge Plan: [place]   Discharge Planning Services: CM Consult Post Acute Care Choice: [place] arrangements for the past 2 months: Single Family Home                 DME Arranged: N/A         HH Arranged: PT HH Agency: [facility] [employer] [facility] [employer] Date [place]: 08/03/18 Time [place]: [place] Representative spoke with at [place]: [place]  Arrangements/Services Living arrangements for the past 2 months: [place] Lives with:: Spouse Patient language and need for interpreter reviewed:: No Do you feel safe going back to the place where you live?: Yes      Need for Family Participation in Patient Care: No (Comment) Care giver support system in place?: Yes (comment) Current home services: DME(cane, RW, high rise toilet, crutches) Criminal Activity/Legal Involvement Pertinent to Current Situation/Hospitalization: No - Comment as needed  Activities of Daily Living Home Assistive Devices/Equipment: Eyeglasses, Cane (specify quad or straight), Walker (specify type), Crutches ADL Screening (condition at time of admission) Patient's cognitive ability adequate to safely complete daily activities?: Yes Is the patient deaf or have difficulty hearing?: No Does the patient have difficulty seeing, even when wearing glasses/contacts?: No Does the patient have difficulty concentrating, remembering, or making decisions?: No Patient able to express need for assistance with ADLs?: Yes Does the patient have difficulty dressing or bathing?: No Independently performs ADLs?: Yes (appropriate for developmental age) Does the patient have difficulty walking or climbing stairs?: Yes Weakness of Legs: Left Weakness of Arms/Hands: None  Permission Sought/Granted Permission sought to share information with : Case Manager Permission granted to share information with : Yes, Verbal Permission Granted     Permission granted to share info w AGENCY: [facility]        Emotional Assessment Appearance:: Appears stated age  Attitude/Demeanor/Rapport: Engaged Affect (typically observed): Accepting Orientation: : Oriented to Self, Oriented to Place, Oriented to  Time, Oriented to Situation Alcohol / Substance Use: Not Applicable Psych Involvement: No (comment)  Admission diagnosis:  PRIMARY OSTEOARTHRITIS OF LEFT HIP Patient Active Problem List    Diagnosis Date Noted  . H/O total hip arthroplasty 08/03/2018  . BMI 40.0-44.9, adult ([place]) 07/27/2018  . Elevated bilirubin   . Umbilical hernia without obstruction and without gangrene   . Cholelithiasis   . Choledocholithiasis   . Elevated LFTs   . Abdominal pain 10/29/2017  . Parkinsonism ([place]) 10/29/2017  . Nephrolithiasis 10/23/2016  . SIRS (systemic inflammatory response syndrome) ([place]) 03/23/2016  . Bleeding hemorrhoids 10/22/2015  . Benign essential hypertension 10/22/2015  . Hyperlipidemia, mixed 04/18/2015  . Chronic gouty arthritis 04/18/2015  . DM (diabetes mellitus) type II controlled with renal manifestation ([place]) 07/16/2013  . OSA on CPAP 07/06/2013  . AVM (arteriovenous malformation) brain 07/06/2013   PCP:  Rusty Aus, MD Pharmacy:   [facility], [place] - [place] [address] Phone: [phone] Fax: [phone]     Social Determinants of Health (SDOH) Interventions    Readmission Risk Interventions No flowsheet data found.

## 2018-08-03 NOTE — TOC Benefit Eligibility Note (Signed)
Transition of Care (TOC) Benefit Eligibility Note    Patient Details  Name: Jerry Sandoval MRN: [id number] Date of Birth: 07/16/1965   Medication/Dose: ENOXAPARIN 40 MG DAILY 14 SYRINGES  Covered?: Yes  Tier: 2 Drug  Prescription Coverage [facility]  Spoke with Person/Company/Phone Number:: DANIEL @ [employer] # [phone]  Co-Pay: $ 30.00  Prior Approval: No  Deductible: Met  Additional Notes: LOVENOX  40 MG DAILY : NOT COVER , PRIOR APPROVAL- YES # [phone] OPT-3, OPT-2    [place], Dora Phone Number: 08/03/2018, 4:19 PM     

## 2018-08-03 NOTE — Transfer of Care (Signed)
Immediate Anesthesia Transfer of Care Note  [name]  Procedure(s) Performed: LEFT TOTAL HIP ARTHROPLASTY (Left )  Patient Location: PACU  Anesthesia Type:General  Level of Consciousness: sedated  Airway & Oxygen Therapy: Patient Spontanous Breathing and Patient connected to face mask oxygen  Post-op Assessment: Report given to RN and Post -op Vital signs reviewed and stable  Post vital signs: Reviewed and stable  Last Vitals:  Vitals Value Taken Time  BP 134/80 08/03/18 1314  Temp    Pulse 115 08/03/18 1319  Resp 14 08/03/18 1319  SpO2 95 % 08/03/18 1319  Vitals shown include unvalidated device data.  Last Pain:  Vitals:   08/03/18 0714  TempSrc:   PainSc: 8          Complications: No apparent anesthesia complications

## 2018-08-03 NOTE — Anesthesia Procedure Notes (Signed)
Procedure Name: Intubation Performed by: Justus Memory, CRNA Pre-anesthesia Checklist: Patient identified, Patient being monitored, Timeout performed, Emergency Drugs available and Suction available Patient Re-evaluated:Patient Re-evaluated prior to induction Oxygen Delivery Method: Circle system utilized Preoxygenation: Pre-oxygenation with 100% oxygen Induction Type: IV induction Ventilation: Mask ventilation without difficulty Laryngoscope Size: Mac and 4 Grade View: Grade IV Tube type: Oral Tube size: 7.5 mm Number of attempts: 1 Airway Equipment and Method: Stylet Placement Confirmation: ETT inserted through vocal cords under direct vision,  positive ETCO2 and breath sounds checked- equal and bilateral Secured at: 24 cm Tube secured with: Tape Dental Injury: Teeth and Oropharynx as per pre-operative assessment  Future Recommendations: Recommend- induction with short-acting agent, and alternative techniques readily available

## 2018-08-03 NOTE — TOC Progression Note (Signed)
Transition of Care [facility]) - Progression Note    Patient Details  Name: Jerry Sandoval MRN: [id number] Date of Birth: 19-Jul-1965  Transition of Care [facility]) CM/SW Boaz, RN Phone Number: 08/03/2018, 11:59 AM  Clinical Narrative:    Requested the price for the Lovenox 40 mg, will notify the patient once the price is obtained        Expected Discharge Plan and Services                                                 Social Determinants of Health (SDOH) Interventions    Readmission Risk Interventions No flowsheet data found.

## 2018-08-03 NOTE — Op Note (Addendum)
OPERATIVE NOTE  DATE OF SURGERY:  08/03/2018  PATIENT NAME:  Jerry Sandoval   DOB: 09/08/1965  MRN: [id number]  PRE-OPERATIVE DIAGNOSIS: Degenerative arthrosis of the left hip, primary  POST-OPERATIVE DIAGNOSIS:  Same  PROCEDURE:  Left total hip arthroplasty  SURGEON:  Jena GaussJames P Trayce Caravello, Jr. M.D.  ASSISTANT: Van ClinesJon Wolfe, PA (present and scrubbed throughout the case, critical for assistance with exposure, retraction, instrumentation, and closure)  ANESTHESIA: spinal and general  ESTIMATED BLOOD LOSS: 100 mL  FLUIDS REPLACED: 1600 mL of crystalloid  DRAINS: 2 medium drains to a Hemovac reservoir  IMPLANTS UTILIZED: DePuy 12 mm small stature AML femoral stem, 54 mm OD Pinnacle 100 acetabular component, +4 mm degree Pinnacle Marathon polyethylene insert, and a 36 mm M-SPEC +1.5 mm hip ball  INDICATIONS FOR SURGERY: Jerry FootmanChristopher W Janoski is a 53 y.o. year old male with a long history of progressive hip and groin  pain. X-rays demonstrated severe degenerative changes. The patient had not seen any significant improvement despite conservative nonsurgical intervention. After discussion of the risks and benefits of surgical intervention, the patient expressed understanding of the risks benefits and agree with plans for total hip arthroplasty.   The risks, benefits, and alternatives were discussed at length including but not limited to the risks of infection, bleeding, nerve injury, stiffness, blood clots, the need for revision surgery, limb length inequality, dislocation, cardiopulmonary complications, among others, and they were willing to proceed.  PROCEDURE IN DETAIL: The patient was brought into the operating room and, after adequate spinal and general anesthesia was achieved, the patient was placed in a right lateral decubitus position. Axillary roll was placed and all bony prominences were well-padded. The patient's left hip was cleaned and prepped with alcohol and DuraPrep and draped in the  usual sterile fashion. A "timeout" was performed as per usual protocol. A lateral curvilinear incision was made gently curving towards the posterior superior iliac spine. The IT band was incised in line with the skin incision and the fibers of the gluteus maximus were split in line. The piriformis tendon was identified, skeletonized, and incised at its insertion to the proximal femur and reflected posteriorly. A T type posterior capsulotomy was performed. Prior to dislocation of the femoral head, a threaded Steinmann pin was inserted through a separate stab incision into the pelvis superior to the acetabulum and bent in the form of a stylus so as to assess limb length and hip offset throughout the procedure.  The gluteal sling was incised so as to better mobilize the proximal femur.  The femoral head was then dislocated posteriorly. Inspection of the femoral head demonstrated severe degenerative changes with full-thickness loss of articular cartilage. The femoral neck cut was performed using an oscillating saw. The anterior capsule was elevated off of the femoral neck using a periosteal elevator. Attention was then directed to the acetabulum. The remnant of the labrum was excised using electrocautery. Inspection of the acetabulum also demonstrated significant degenerative changes. The acetabulum was reamed in sequential fashion up to a 53 mm diameter. Good punctate bleeding bone was encountered. A 54 mm Pinnacle 100 acetabular component was positioned and impacted into place. Good scratch fit was appreciated. A neutral polyethylene trial was inserted.  Attention was then directed to the proximal femur. A hole for reaming of the proximal femoral canal was created using a high-speed burr. The femoral canal was reamed in sequential fashion up to a 11.5 mm diameter. This allowed for approximately 6 cm of scratch fit.  It was  thus elected to ream up to a 12 mm diameter to allow for a line to line fit.  Serial broaches  were inserted up to a 12 mm small stature femoral broach. Calcar region was planed and a trial reduction was performed using a 36 mm hip ball with a less 1.5 mm neck length.  Reasonably good stability was appreciated, but it was elected to trial with a +4 mm 10 degree liner with a high side positioned at the 4 o'clock position.  Good equalization of limb lengths and hip offset was appreciated and excellent stability was noted both anteriorly and posteriorly. Trial components were removed. The acetabular shell was irrigated with copious amounts of normal saline with antibiotic solution and suctioned dry. A +4 mm 10 degree Pinnacle Marathon polyethylene insert was positioned with the high side at the 4 o'clock position and impacted into place. Next, a 12 mm small stature AML femoral stem was positioned and impacted into place. Excellent scratch fit was appreciated. A trial reduction was again performed with a 36 mm hip ball with a +1.5 mm neck length. Again, good equalization of limb lengths was appreciated and excellent stability appreciated both anteriorly and posteriorly. The hip was then dislocated and the trial hip ball was removed. The Morse taper was cleaned and dried. A 36 mm M-SPEC hip ball with a +1.5 mm neck length was placed on the trunnion and impacted into place. The hip was then reduced and placed through range of motion. Excellent stability was appreciated both anteriorly and posteriorly.  The wound was irrigated with copious amounts of normal saline with antibiotic solution and suctioned dry. Good hemostasis was appreciated. The posterior capsulotomy was repaired using #5 Ethibond. Piriformis tendon was reapproximated to the undersurface of the gluteus medius tendon using #5 Ethibond.  The gluteal sling was also repaired using interrupted sutures of #5 Ethibond.  Two medium drains were placed in the wound bed and brought out through separate stab incisions to be attached to a Hemovac reservoir. The  IT band was reapproximated using interrupted sutures of #1 Vicryl. Subcutaneous tissue was approximated using first #0 Vicryl followed by #2-0 Vicryl. The skin was closed with skin staples.  The patient tolerated the procedure well and was transported to the recovery room in stable condition.   Marciano Sequin., M.D.

## 2018-08-03 NOTE — Anesthesia Preprocedure Evaluation (Addendum)
Anesthesia Evaluation  Patient identified by MRN, date of birth, ID band Patient awake    Reviewed: Allergy & Precautions, NPO status , Patient's Chart, lab work & pertinent test results  Airway Mallampati: II  TM Distance: >3 FB     Dental  (+) Teeth Intact, Chipped   Pulmonary asthma , sleep apnea , former smoker,    - rhonchi       Cardiovascular Exercise Tolerance: Good hypertension, Pt. on medications  Rhythm:Regular Rate:Normal     Neuro/Psych negative neurological ROS  negative psych ROS   GI/Hepatic Neg liver ROS,   Endo/Other  diabetes, Well Controlled, Type 2, Oral Hypoglycemic AgentsMorbid obesity  Renal/GU Renal InsufficiencyRenal diseasenegative Renal ROS     Musculoskeletal  (+) Arthritis , Osteoarthritis,    Abdominal (+) + obese,   Peds  Hematology negative hematology ROS (+)   Anesthesia Other Findings   Reproductive/Obstetrics                             Anesthesia Physical  Anesthesia Plan  ASA: III  Anesthesia Plan:    Post-op Pain Management:    Induction:   PONV Risk Score and Plan:   Airway Management Planned:   Additional Equipment:   Intra-op Plan:   Post-operative Plan: Extubation in OR  Informed Consent: I have reviewed the patients History and Physical, chart, labs and discussed the procedure including the risks, benefits and alternatives for the proposed anesthesia with the patient or authorized representative who has indicated his/her understanding and acceptance.       Plan Discussed with: CRNA and Surgeon  Anesthesia Plan Comments:         Anesthesia Quick Evaluation

## 2018-08-03 NOTE — Anesthesia Procedure Notes (Signed)
Spinal  Patient location during procedure: OR Start time: 08/03/2018 8:24 AM End time: 08/03/2018 8:28 AM Staffing Resident/CRNA: Justus Memory, CRNA Performed: resident/CRNA  Preanesthetic Checklist Completed: patient identified, site marked, surgical consent, pre-op evaluation, timeout performed, IV checked, risks and benefits discussed and monitors and equipment checked Spinal Block Patient position: sitting Prep: Betadine Patient monitoring: heart rate, continuous pulse ox, blood pressure and cardiac monitor Approach: midline Location: L4-5 Injection technique: single-shot Needle Needle type: Whitacre and Introducer  Needle gauge: 25 G Needle length: 9 cm Additional Notes Negative paresthesia. Negative blood return. Positive free-flowing CSF. Expiration date of kit checked and confirmed. Patient tolerated procedure well, without complications.

## 2018-08-03 NOTE — Anesthesia Post-op Follow-up Note (Signed)
Anesthesia QCDR form completed.        

## 2018-08-03 NOTE — Discharge Instructions (Signed)
Instructions after Total Hip Replacement ° ° °  Latana Colin P. Natoshia Souter, Jr., M.D.    ° Dept. of Orthopaedics & Sports Medicine ° [facility] ° [address] ° [place], [place]  [address] ° Phone: [phone]   Fax: [phone] ° °  °DIET: °• Drink plenty of non-alcoholic fluids. °• Resume your normal diet. Include foods high in fiber. ° °ACTIVITY:  °• You may use crutches or a walker with weight-bearing as tolerated, unless instructed otherwise. °• You may be weaned off of the walker or crutches by your Physical Therapist.  °• Do NOT reach below the level of your knees or cross your legs until allowed.    °• Continue doing gentle exercises. Exercising will reduce the pain and swelling, increase motion, and prevent muscle weakness.   °• Please continue to use the TED compression stockings for 6 weeks. You may remove the stockings at night, but should reapply them in the morning. °• Do not drive or operate any equipment until instructed. ° °WOUND CARE:  °• Continue to use ice packs periodically to reduce pain and swelling. °• Keep the incision clean and dry. °• You may bathe or shower after the staples are removed at the first office visit following surgery. ° °MEDICATIONS: °• You may resume your regular medications. °• Please take the pain medication as prescribed on the medication. °• Do not take pain medication on an empty stomach. °• You have been given a prescription for a blood thinner to prevent blood clots. Please take the medication as instructed. (NOTE: After completing a 2 week course of Lovenox, take one Enteric-coated aspirin once a day.) °• Pain medications and iron supplements can cause constipation. Use a stool softener (Senokot or Colace) on a daily basis and a laxative (dulcolax or miralax) as needed. °• Do not drive or drink alcoholic beverages when taking pain medications. ° °CALL THE OFFICE FOR: °• Temperature above 101 degrees °• Excessive bleeding or drainage on the dressing. °• Excessive  swelling, coldness, or paleness of the toes. °• Persistent nausea and vomiting. ° °FOLLOW-UP:  °• You should have an appointment to return to the office in 6 weeks after surgery. °• Arrangements have been made for continuation of Physical Therapy (either home therapy or outpatient therapy). °  °

## 2018-08-03 NOTE — H&P (Signed)
The patient has been re-examined, and the chart reviewed, and there have been no interval changes to the documented history and physical.    The risks, benefits, and alternatives have been discussed at length. The patient expressed understanding of the risks benefits and agreed with plans for surgical intervention.  James P. Hooten, Jr. M.D.    

## 2018-08-03 NOTE — Evaluation (Signed)
Physical Therapy Evaluation Patient Details Name: Jerry FootmanChristopher W Sandoval MRN: [id number] DOB: 07/21/1965 Today's Date: 08/03/2018   History of Present Illness  Pt admitted for L THR.  Clinical Impression  Pt is a pleasant 53 year old male who was admitted for L THR. Pt performs bed mobility/transfers with min assist and ambulation with cga and RW. Educated on post hip precautions. Appears motivated to perform therapy. Pt demonstrates deficits with strength/mobility/transfers. Currently limited by pain at this time. Would benefit from skilled PT to address above deficits and promote optimal return to PLOF. Recommend transition to HHPT upon discharge from acute hospitalization.       Follow Up Recommendations Home health PT    Equipment Recommendations  None recommended by PT    Recommendations for Other Services       Precautions / Restrictions Precautions Precautions: Posterior Hip;Fall Precaution Booklet Issued: No Restrictions Weight Bearing Restrictions: Yes LLE Weight Bearing: Weight bearing as tolerated      Mobility  Bed Mobility Overal bed mobility: Needs Assistance Bed Mobility: Supine to Sit     Supine to sit: Min assist     General bed mobility comments: cues for sequencing to maintain hip precautions. Once seated at EOB, bed elevated to maintain hip. Reports increased pain with activity.  Transfers Overall transfer level: Needs assistance Equipment used: Rolling walker (2 wheeled) Transfers: Sit to/from Stand Sit to Stand: Min assist         General transfer comment: Cues for hand placement. Educated on WB prior to standing. Once upright, hesitant to WB on surgical leg  Ambulation/Gait Ambulation/Gait assistance: Min guard Gait Distance (Feet): 5 Feet Assistive device: Rolling walker (2 wheeled) Gait Pattern/deviations: Step-to pattern     General Gait Details: ambulated using RW and slow antalgic gait. Reports 10/10 pain with ambulation. Further  gait deferred  Stairs            Wheelchair Mobility    Modified Rankin (Stroke Patients Only)       Balance Overall balance assessment: Needs assistance Sitting-balance support: Feet supported Sitting balance-Leahy Scale: Good     Standing balance support: Bilateral upper extremity supported Standing balance-Leahy Scale: Good                               Pertinent Vitals/Pain Pain Assessment: 0-10 Pain Score: 10-Worst pain ever Pain Location: L hip Pain Descriptors / Indicators: Operative site guarding Pain Intervention(s): Limited activity within patient's tolerance;Repositioned    Home Living Family/patient expects to be discharged to:: Private residence Living Arrangements: Spouse/significant other Available Help at Discharge: Family Type of Home: House Home Access: Stairs to enter Entrance Stairs-Rails: None Entrance Stairs-Number of Steps: 3 Home Layout: One level Home Equipment: Environmental consultantWalker - 2 wheels;Cane - single point;Crutches(elevated toilet)      Prior Function Level of Independence: Independent         Comments: didn't use AD in community setting, however used B crutches at home     Hand Dominance        Extremity/Trunk Assessment   Upper Extremity Assessment Upper Extremity Assessment: Overall WFL for tasks assessed    Lower Extremity Assessment Lower Extremity Assessment: Generalized weakness(L LE grossly 4/5; R LE grossly 5/5)       Communication   Communication: No difficulties  Cognition Arousal/Alertness: Awake/alert Behavior During Therapy: WFL for tasks assessed/performed Overall Cognitive Status: Within Functional Limits for tasks assessed  General Comments      Exercises Other Exercises Other Exercises: supine ther-ex performed on L LE including AP, quad sets, glut sets, hip abd/add, and SAQ. All ther-ex performed x 10 reps with min assist and cues  for correct technique.   Assessment/Plan    PT Assessment Patient needs continued PT services  PT Problem List Decreased strength;Decreased activity tolerance;Decreased balance;Decreased mobility;Decreased knowledge of use of DME;Pain       PT Treatment Interventions DME instruction;Gait training;Therapeutic exercise;Balance training    PT Goals (Current goals can be found in the Care Plan section)  Acute Rehab PT Goals Patient Stated Goal: to go home PT Goal Formulation: With patient Time For Goal Achievement: 08/17/18 Potential to Achieve Goals: Good    Frequency BID   Barriers to discharge        Co-evaluation               AM-PAC PT "6 Clicks" Mobility  Outcome Measure Help needed turning from your back to your side while in a flat bed without using bedrails?: A Little Help needed moving from lying on your back to sitting on the side of a flat bed without using bedrails?: A Little Help needed moving to and from a bed to a chair (including a wheelchair)?: A Little Help needed standing up from a chair using your arms (e.g., wheelchair or bedside chair)?: A Little Help needed to walk in hospital room?: A Little Help needed climbing 3-5 steps with a railing? : A Lot 6 Click Score: 17    End of Session Equipment Utilized During Treatment: Gait belt Activity Tolerance: Patient tolerated treatment well Patient left: in chair;with chair alarm set;with SCD's reapplied Nurse Communication: Mobility status PT Visit Diagnosis: Unsteadiness on feet (R26.81);Muscle weakness (generalized) (M62.81);Difficulty in walking, not elsewhere classified (R26.2);Pain Pain - Right/Left: Left Pain - part of body: Hip    Time: 0539-7673 PT Time Calculation (min) (ACUTE ONLY): 26 min   Charges:   PT Evaluation $PT Eval Low Complexity: 1 Low PT Treatments $Therapeutic Exercise: 8-22 mins        Greggory Stallion, PT, DPT [phone]   Jerry Sandoval 08/03/2018, 4:28 PM

## 2018-08-04 ENCOUNTER — Encounter: Payer: Self-pay | Admitting: Orthopedic Surgery

## 2018-08-04 LAB — GLUCOSE, CAPILLARY
Glucose-Capillary: 128 mg/dL — ABNORMAL HIGH (ref 70–99)
Glucose-Capillary: 120 mg/dL — ABNORMAL HIGH (ref 70–99)
Glucose-Capillary: 99 mg/dL (ref 70–99)
Glucose-Capillary: 120 mg/dL — ABNORMAL HIGH (ref 70–99)

## 2018-08-04 NOTE — Progress Notes (Signed)
  Subjective: 1 Day Post-Op Procedure(s) (LRB): LEFT TOTAL HIP ARTHROPLASTY (Left) Patient reports pain as mild.   Patient seen in rounds with Dr. Marry Guan. Patient is well, and has had no acute complaints or problems Plan is to go Home after hospital stay. Negative for chest pain and shortness of breath Fever: no Gastrointestinal: Negative for nausea and vomiting.  The patient had a bowel movement yesterday.  Objective: Vital signs in last 24 hours: Temp:  [97 F (36.1 C)-100.1 F (37.8 C)] 98.9 F (37.2 C) (06/18 0400) Pulse Rate:  [72-119] 72 (06/18 0612) Resp:  [15-19] 19 (06/18 0400) BP: (107-148)/(70-95) 107/74 (06/18 0612) SpO2:  [91 %-98 %] 98 % (06/18 0400) Weight:  [137.9 kg] 137.9 kg (06/17 0708)  Intake/Output from previous day:  Intake/Output Summary (Last 24 hours) at 08/04/2018 0705 Last data filed at 08/04/2018 0600 Gross per 24 hour  Intake 4149.71 ml  Output 4090 ml  Net 59.71 ml    Intake/Output this shift: No intake/output data recorded.  Labs: No results for input(s): HGB in the last 72 hours. No results for input(s): WBC, RBC, HCT, PLT in the last 72 hours. No results for input(s): NA, K, CL, CO2, BUN, CREATININE, GLUCOSE, CALCIUM in the last 72 hours. No results for input(s): LABPT, INR in the last 72 hours.   EXAM General - Patient is Alert and Oriented Extremity - Neurovascular intact Sensation intact distally Dorsiflexion/Plantar flexion intact Compartment soft Dressing/Incision - clean, dry, with a Hemovac intact Motor Function - intact, moving foot and toes well on exam.   Past Medical History:  Diagnosis Date  . Arteriovenous malformation of cerebral vessels   . Arthritis   . Carpal tunnel syndrome   . Diabetes mellitus without complication ([place])   . Headache    h/o  . History of kidney stones   . Hyperlipidemia   . Hypertension   . Occasional tremors   . Sleep apnea    uses cpap 5-20    Assessment/Plan: 1 Day Post-Op  Procedure(s) (LRB): LEFT TOTAL HIP ARTHROPLASTY (Left) Active Problems:   H/O total hip arthroplasty  Estimated body mass index is 42.41 kg/m as calculated from the following:   Height as of this encounter: 5\' 11"  (1.803 m).   Weight as of this encounter: 137.9 kg. Advance diet Up with therapy D/C IV fluids Discharge home with home health plan for tomorrow  DVT Prophylaxis - Lovenox, Foot Pumps and TED hose Weight-Bearing as tolerated to left leg  Reche Dixon, PA-C Orthopaedic Surgery 08/04/2018, 7:05 AM

## 2018-08-04 NOTE — TOC Progression Note (Signed)
Transition of Care [facility]) - Progression Note    Patient Details  Name: Jerry Sandoval MRN: [id number] Date of Birth: 09/03/1965  [facility]) CM/SW Contact  Sanders Manninen, Lenice Llamas Phone Number: [phone]  08/04/2018, 5:02 PM  Clinical Narrative: Clinical Social Worker (CSW) notified patient of his Lovenox price $30. Patient is okay with Lovenox price.     Expected Discharge Plan: [place] Barriers to Discharge: Continued Medical Work up  Expected Discharge Plan and Services Expected Discharge Plan: [place]   Discharge Planning Services: CM Consult Post Acute Care Choice: [place] arrangements for the past 2 months: Single Family Home                 DME Arranged: N/A         HH Arranged: PT HH [facility]: [facility] [employer] Date [place]: 08/03/18 Time [place]: 1453 Representative spoke with at [place]: [place] ([place]) Interventions    Readmission Risk Interventions No flowsheet data found.

## 2018-08-04 NOTE — Progress Notes (Signed)
Physical Therapy Treatment Patient Details Name: Jerry FootmanChristopher W Taboada MRN: [id number] DOB: 11/13/1965 Today's Date: 08/04/2018    History of Present Illness Pt admitted for L THR.    PT Comments    In recliner, noted dec BP per chart.  Pt stated he is having no symptoms of low BP.  Stood with supervision and ambulated to bathroom then continued on to nursing station and back to bed with walker and supervision.  No reports of dizziness during session or LOB's.  Large soft BM.  Returned to supine with min assist for LE.  Reviewed precautions and pt has good awareness.   Follow Up Recommendations  Home health PT     Equipment Recommendations  None recommended by PT    Recommendations for Other Services       Precautions / Restrictions Precautions Precautions: Posterior Hip;Fall Restrictions Weight Bearing Restrictions: Yes LLE Weight Bearing: Weight bearing as tolerated    Mobility  Bed Mobility Overal bed mobility: Needs Assistance Bed Mobility: Sit to Supine       Sit to supine: Min assist   General bed mobility comments: for LE management  Transfers Overall transfer level: Needs assistance Equipment used: Rolling walker (2 wheeled) Transfers: Sit to/from Stand Sit to Stand: Modified independent (Device/Increase time)            Ambulation/Gait Ambulation/Gait assistance: Modified independent (Device/Increase time);Supervision Gait Distance (Feet): 120 Feet Assistive device: Rolling walker (2 wheeled) Gait Pattern/deviations: Step-through pattern     General Gait Details: generally steady with no LOB   Stairs             Wheelchair Mobility    Modified Rankin (Stroke Patients Only)       Balance Overall balance assessment: Modified Independent Sitting-balance support: Feet supported Sitting balance-Leahy Scale: Good     Standing balance support: Bilateral upper extremity supported Standing balance-Leahy Scale: Good                              Cognition Arousal/Alertness: Awake/alert Behavior During Therapy: WFL for tasks assessed/performed Overall Cognitive Status: Within Functional Limits for tasks assessed                                        Exercises Other Exercises Other Exercises: to commode for large soft BM.  Nursing aware.    General Comments        Pertinent Vitals/Pain Pain Assessment: 0-10 Pain Score: 2  Pain Location: L hip Pain Descriptors / Indicators: Operative site guarding Pain Intervention(s): Limited activity within patient's tolerance;Monitored during session;Repositioned    Home Living                      Prior Function            PT Goals (current goals can now be found in the care plan section) Progress towards PT goals: Progressing toward goals    Frequency    BID      PT Plan Current plan remains appropriate    Co-evaluation              AM-PAC PT "6 Clicks" Mobility   Outcome Measure  Help needed turning from your back to your side while in a flat bed without using bedrails?: A Little Help needed moving from lying on your back to sitting  on the side of a flat bed without using bedrails?: A Little Help needed moving to and from a bed to a chair (including a wheelchair)?: None Help needed standing up from a chair using your arms (e.g., wheelchair or bedside chair)?: None Help needed to walk in hospital room?: A Little Help needed climbing 3-5 steps with a railing? : A Little 6 Click Score: 20    End of Session Equipment Utilized During Treatment: Gait belt Activity Tolerance: Patient tolerated treatment well Patient left: in bed;with call bell/phone within reach;with bed alarm set Nurse Communication: Other (comment) Pain - Right/Left: Left Pain - part of body: Hip     Time: 3646-8032 PT Time Calculation (min) (ACUTE ONLY): 17 min  Charges:  $Gait Training: 8-22 mins                     Chesley Noon,  PTA 08/04/18, 3:20 PM

## 2018-08-04 NOTE — Progress Notes (Signed)
Pts BP 92/49. Pt asymptomatic, with no complaints. MD Hooten notified. Per MD continue to monitor, no orders received.

## 2018-08-04 NOTE — Evaluation (Signed)
Occupational Therapy Evaluation Patient Details Name: Jerry FootmanChristopher W Sandoval MRN: [id number] DOB: 02/16/1966 Today's Date: 08/04/2018    History of Present Illness Pt admitted for L THR.   Clinical Impression   Pt seen for OT evaluation this date, POD#1 from above surgery. Pt was independent in all ADL prior to surgery, however using AD for ambulation at home and increasingly limited with mobility at work due tp L hip pain. Pt is eager to return to PLOF with less pain and improved safety and independence. Pt currently requires min-mod assist for LB dressing and bathing while in seated position due to pain and limited AROM of L hip. Pt able to recall 2/3 posterior total hip precautions at start of session and able to verbalize how to implement during functional transfers and bed mobility. Pt instructed in posterior total hip precautions and how to implement, self care skills, falls prevention strategies including pet care considerations, home/routines modifications, DME/AE for LB bathing and dressing tasks, compression stocking mgt strategies, and car transfer techniques. At end of session, pt able to recall 3/3 posterior total hip precautions. Pt would benefit from additional instruction in self care skills and techniques to help maintain precautions with or without assistive devices to support recall and carryover prior to discharge. Do not anticipate need for skilled OT services at discharge.      Follow Up Recommendations  No OT follow up    Equipment Recommendations  3 in 1 bedside commode;Other (comment)(reacher)    Recommendations for Other Services       Precautions / Restrictions Precautions Precautions: Posterior Hip;Fall Precaution Booklet Issued: No Precaution Comments: Pt able to recall 2/3 precautions at start of session and able to verbalize how to maintain during functional transfers Restrictions Weight Bearing Restrictions: Yes LLE Weight Bearing: Weight bearing as tolerated       Mobility Bed Mobility               General bed mobility comments: pt politely deferring 2/2 no pain meds yet  Transfers                 General transfer comment: pt politely deferring 2/2 no pain meds yet    Balance                                           ADL either performed or assessed with clinical judgement   ADL Overall ADL's : Needs assistance/impaired                                       General ADL Comments: Min-Mod A for LB ADL to maintain precautions     Vision Baseline Vision/History: Wears glasses Wears Glasses: At all times Patient Visual Report: No change from baseline       Perception     Praxis      Pertinent Vitals/Pain Pain Assessment: 0-10 Pain Score: 6  Pain Location: L hip Pain Descriptors / Indicators: Operative site guarding Pain Intervention(s): Limited activity within patient's tolerance;Monitored during session;Patient requesting pain meds-RN notified     Hand Dominance     Extremity/Trunk Assessment Upper Extremity Assessment Upper Extremity Assessment: Overall WFL for tasks assessed   Lower Extremity Assessment Lower Extremity Assessment: Defer to PT evaluation(expected post-op strength/ROM deficits in LLE)   Cervical /  Trunk Assessment Cervical / Trunk Assessment: Normal   Communication Communication Communication: No difficulties   Cognition Arousal/Alertness: Awake/alert Behavior During Therapy: WFL for tasks assessed/performed Overall Cognitive Status: Within Functional Limits for tasks assessed                                     General Comments       Exercises Other Exercises Other Exercises: pt instructed in AE/DME for ADL, how to maintain precautions during ADL, falls prevention with pet care considerations, RW use in bathroom vs kitchen environment, compression stocking mgt, and bed mobility   Shoulder Instructions      Home Living  Family/patient expects to be discharged to:: Private residence Living Arrangements: Spouse/significant other Available Help at Discharge: Family Type of Home: House Home Access: Stairs to enter Secretary/administratorntrance Stairs-Number of Steps: 3 Entrance Stairs-Rails: None Home Layout: One level         Bathroom Toilet: Handicapped height     Home Equipment: Environmental consultantWalker - 2 wheels;Cane - single point;Crutches(elevated toilet)   Additional Comments[facility] he can borrow from family if needed; works full time as a Furniture conservator/restorerproduction manager      Prior Functioning/Environment Level of Independence: Independent        Comments: didn't use AD in community setting, however used B crutches/SPC/2WW at home        OT Problem List: Decreased strength;Decreased range of motion;Decreased knowledge of use of DME or AE;Pain;Decreased knowledge of precautions      OT Treatment/Interventions: Self-care/ADL training;Therapeutic exercise;Therapeutic activities;DME and/or AE instruction;Patient/family education;Balance training    OT Goals(Current goals can be found in the care plan section) Acute Rehab OT Goals Patient Stated Goal: to go home OT Goal Formulation: With patient Time For Goal Achievement: 08/18/18 Potential to Achieve Goals: Good ADL Goals Pt Will Perform Lower Body Dressing: with supervision;sit to/from stand;with adaptive equipment(maintaining hip precautions) Pt Will Transfer to Toilet: with supervision;ambulating(comfort height toilet, LRAD for amb, maintaining hip precautions) Additional ADL Goal #1: Pt will independently instruct family in compression stocking mgt Additional ADL Goal #2: Pt will independently verbalize 100% of posterior total hip precautions and verbalize how to implement during functional transfers and LB ADL tasks.  OT Frequency: Min 1X/week   Barriers to D/C:            Co-evaluation              AM-PAC OT "6 Clicks" Daily Activity     Outcome Measure  Help from another person eating meals?: None Help from another person taking care of personal grooming?: None Help from another person toileting, which includes using toliet, bedpan, or urinal?: A Little Help from another person bathing (including washing, rinsing, drying)?: A Little Help from another person to put on and taking off regular upper body clothing?: None Help from another person to put on and taking off regular lower body clothing?: A Little 6 Click Score: 21   End of Session    Activity Tolerance: Patient tolerated treatment well Patient left: in bed;with call bell/phone within reach;with bed alarm set;Other (comment);with SCD's reapplied(pillows between legs)  OT Visit Diagnosis: Other abnormalities of gait and mobility (R26.89);Pain Pain - Right/Left: Left Pain - part of body: Hip                Time: [id number] OT Time Calculation (min): 28 min Charges:  OT General Charges $OT Visit:  1 Visit OT Evaluation $OT Eval Low Complexity: 1 Low OT Treatments $Self Care/Home Management : 8-22 mins $Therapeutic Activity: 8-22 mins  Jeni Salles, MPH, MS, OTR/L ascom [phone] 08/04/18, 9:46 AM

## 2018-08-04 NOTE — Progress Notes (Signed)
Physical Therapy Treatment Patient Details Name: Jerry FootmanChristopher W Sandoval MRN: [id number] DOB: 09/02/1965 Today's Date: 08/04/2018    History of Present Illness Pt admitted for L THR.    PT Comments    Pt is making good progress towards goals and appears motivated to participate. Increased ambulation distance this session, limited by pain. Good endurance with there-ex, reviewed written precautions. Will continue to progress.   Follow Up Recommendations  Home health PT     Equipment Recommendations  None recommended by PT    Recommendations for Other Services       Precautions / Restrictions Precautions Precautions: Posterior Hip;Fall Precaution Booklet Issued: Yes (comment) Precaution Comments: Pt able to recall 2/3 precautions Restrictions Weight Bearing Restrictions: Yes LLE Weight Bearing: Weight bearing as tolerated    Mobility  Bed Mobility Overal bed mobility: Needs Assistance Bed Mobility: Supine to Sit     Supine to sit: Min assist     General bed mobility comments: needs cues for sequencing and to maintain hip precautions. ONce seated at EOB, upright posture. Bed elevated.  Transfers Overall transfer level: Needs assistance Equipment used: Rolling walker (2 wheeled) Transfers: Sit to/from Stand Sit to Stand: Min guard         General transfer comment: cues for hand placement. Upright posture.  Ambulation/Gait Ambulation/Gait assistance: Min guard Gait Distance (Feet): 80 Feet Assistive device: Rolling walker (2 wheeled) Gait Pattern/deviations: Step-through pattern     General Gait Details: consistent reciprocal gait with RW. Antalgic gait. CUes for hip precautions with L turns   Stairs             Wheelchair Mobility    Modified Rankin (Stroke Patients Only)       Balance Overall balance assessment: Needs assistance Sitting-balance support: Feet supported Sitting balance-Leahy Scale: Good     Standing balance support: Bilateral  upper extremity supported Standing balance-Leahy Scale: Good                              Cognition Arousal/Alertness: Awake/alert Behavior During Therapy: WFL for tasks assessed/performed Overall Cognitive Status: Within Functional Limits for tasks assessed                                        Exercises Other Exercises Other Exercises: supine ther-ex performed on L LE including AP, quad sets, glut sets, hip abd/add. All ther-ex performed x 12 reps with min assist and cues for correct technique. Other Exercises: pt instructed in AE/DME for ADL, how to maintain precautions during ADL, falls prevention with pet care considerations, RW use in bathroom vs kitchen environment, compression stocking mgt, and bed mobility    General Comments        Pertinent Vitals/Pain Pain Assessment: 0-10 Pain Score: 6  Pain Location: L hip Pain Descriptors / Indicators: Operative site guarding Pain Intervention(s): Limited activity within patient's tolerance;Premedicated before session;Repositioned    Home Living Family/patient expects to be discharged to:: Private residence Living Arrangements: Spouse/significant other Available Help at Discharge: Family Type of Home: House Home Access: Stairs to enter Entrance Stairs-Rails: None Home Layout: One level Home Equipment: Environmental consultantWalker - 2 wheels;Cane - single point;Crutches(elevated toilet) Additional Comments[facility] family if needed; works full time as a Furniture conservator/restorerproduction manager    Prior Function Level of Independence: Independent  Comments: didn't use AD in community setting, however used B crutches/SPC/2WW at home   PT Goals (current goals can now be found in the care plan section) Acute Rehab PT Goals Patient Stated Goal: to go home PT Goal Formulation: With patient Time For Goal Achievement: 08/17/18 Potential to Achieve Goals: Good Progress towards PT goals: Progressing toward  goals    Frequency    BID      PT Plan Current plan remains appropriate    Co-evaluation              AM-PAC PT "6 Clicks" Mobility   Outcome Measure  Help needed turning from your back to your side while in a flat bed without using bedrails?: A Little Help needed moving from lying on your back to sitting on the side of a flat bed without using bedrails?: A Little Help needed moving to and from a bed to a chair (including a wheelchair)?: A Little Help needed standing up from a chair using your arms (e.g., wheelchair or bedside chair)?: A Little Help needed to walk in hospital room?: A Little Help needed climbing 3-5 steps with a railing? : A Lot 6 Click Score: 17    End of Session Equipment Utilized During Treatment: Gait belt Activity Tolerance: Patient tolerated treatment well Patient left: in chair;with chair alarm set;with SCD's reapplied Nurse Communication: Mobility status PT Visit Diagnosis: Unsteadiness on feet (R26.81);Muscle weakness (generalized) (M62.81);Difficulty in walking, not elsewhere classified (R26.2);Pain Pain - Right/Left: Left Pain - part of body: Hip     Time: 9390-3009 PT Time Calculation (min) (ACUTE ONLY): 27 min  Charges:  $Gait Training: 8-22 mins $Therapeutic Exercise: 8-22 mins                     Jerry Sandoval, PT, DPT [phone]    Jerry Sandoval 08/04/2018, 11:17 AM

## 2018-08-04 NOTE — Progress Notes (Signed)
HR 129, temp 101. Prn tylenol given. Dr. Roland Rack made aware. No new orders received at this time.

## 2018-08-05 LAB — CBC
HCT: 35 % — ABNORMAL LOW (ref 39.0–52.0)
Hemoglobin: 11.5 g/dL — ABNORMAL LOW (ref 13.0–17.0)
MCH: 31.1 pg (ref 26.0–34.0)
MCHC: 32.9 g/dL (ref 30.0–36.0)
MCV: 94.6 fL (ref 80.0–100.0)
Platelets: 243 10*3/uL (ref 150–400)
RBC: 3.7 MIL/uL — ABNORMAL LOW (ref 4.22–5.81)
RDW: 13.8 % (ref 11.5–15.5)
WBC: 14.1 10*3/uL — ABNORMAL HIGH (ref 4.0–10.5)
nRBC: 0 % (ref 0.0–0.2)

## 2018-08-05 LAB — GLUCOSE, CAPILLARY
Glucose-Capillary: 126 mg/dL — ABNORMAL HIGH (ref 70–99)
Glucose-Capillary: 128 mg/dL — ABNORMAL HIGH (ref 70–99)

## 2018-08-05 MED ORDER — TRAMADOL HCL 50 MG PO TABS: 50.0000 mg | ORAL_TABLET | ORAL | 1 refills | Status: DC | PRN

## 2018-08-05 MED ORDER — ENOXAPARIN SODIUM 40 MG/0.4ML ~~LOC~~ SOLN: 40.0000 mg | SUBCUTANEOUS | 0 refills | Status: DC

## 2018-08-05 MED ORDER — OXYCODONE HCL 5 MG PO TABS: 5.0000 mg | ORAL_TABLET | ORAL | 0 refills | Status: DC | PRN

## 2018-08-05 MED ORDER — CELECOXIB 200 MG PO CAPS: 200.0000 mg | ORAL_CAPSULE | Freq: Two times a day (BID) | ORAL | 1 refills | Status: DC

## 2018-08-05 NOTE — Progress Notes (Signed)
Pt has been tachycardic today at rest with HR ranging in 110s. PT reported that during session, pt's HR did reach 142 while doing stairs. Pt did have temp of 101 degrees last night, given tylenol and is 100.9 this morning & 99.9 on last set. Pt is asymptomatic, denies CP or SB. Dr. Marry Guan notified of above to see if pt could still d/c today. Dr. Marry Guan stated pt was ok to d/c since he is asymptomatic and to encourage IS and fluids. Pt educated.

## 2018-08-05 NOTE — TOC Transition Note (Signed)
Transition of Care [facility]) - CM/SW Discharge Note   Patient Details  Name: Jerry Sandoval MRN: [id number] Date of Birth: Jun 07, 1965  [facility] CM/SW Contact:  Alfio Loescher, Lenice Llamas Phone Number: [phone]  08/05/2018, 9:34 AM   Clinical Narrative: Clinical Social Worker (CSW) notified Helene Kelp [facility] representative that patient will D/C home today. Patient has no equipment needs. Patient has been notified of his Lovenox price $30. Please reconsult if future social work needs arise. CSW signing off.     Final next level of care: [place] Barriers to Discharge: Barriers Resolved   Patient Goals and CMS Choice Patient states their goals for this hospitalization and ongoing recovery are:: To improve mobility CMS Medicare.gov Compare Post Acute Care list provided to:: ([facility] home health arranged by surgeon's office prior to hospital stay.) Choice offered to / list presented to : Patient  Discharge Placement                       Discharge Plan and Services   Discharge Planning Services: CM Consult Post Acute Care Choice: Home Health          DME Arranged: N/A         HH Arranged: PT HH [facility]: [facility] [facility] [employer] Date [place]: 08/05/18 Time [place]: [phone] Representative spoke with at [place]: [place]) Interventions     Readmission Risk Interventions No flowsheet data found.

## 2018-08-05 NOTE — Discharge Summary (Signed)
Physician Discharge Summary  Subjective: 2 Days Post-Op Procedure(s) (LRB): LEFT TOTAL HIP ARTHROPLASTY (Left) Patient reports pain as moderate.   Patient seen in rounds with Dr. Marry Guan. Patient is well, and has had no acute complaints or problems Patient is ready to go home with home health physical therapy.  Physician Discharge Summary  Patient ID: Jerry Sandoval MRN: [id number] DOB/AGE: 1965/08/18 53 y.o.  Admit date: 08/03/2018 Discharge date: 08/05/2018  Admission Diagnoses:  Discharge Diagnoses:  Active Problems:   H/O total hip arthroplasty   Discharged Condition: fair  Hospital Course: The patient is postop day 2 from a left total hip replacement.  He is having more pain this morning but has done very well.  He ambulated 140 feet yesterday.  Had a bowel movement already.  His vitals are stable.  The patient had his drain removed this morning and is ready to go home.  Treatments: surgery:  Degenerative arthrosis of the left hip, primary  POST-OPERATIVE DIAGNOSIS:  Same  PROCEDURE:  Left total hip arthroplasty  SURGEON:  Marciano Sequin. M.D.  ASSISTANT: Vance Peper, PA (present and scrubbed throughout the case, critical for assistance with exposure, retraction, instrumentation, and closure)  ANESTHESIA: spinal and general  ESTIMATED BLOOD LOSS: 100 mL  FLUIDS REPLACED: 1600 mL of crystalloid  DRAINS: 2 medium drains to a Hemovac reservoir  IMPLANTS UTILIZED: DePuy 12 mm small stature AML femoral stem, 54 mm OD Pinnacle 100 acetabular component, +4 mm degree Pinnacle Marathon polyethylene insert, and a 36 mm M-SPEC +1.5 mm hip ball  Discharge Exam: Blood pressure 105/73, pulse (!) 110, temperature (!) 101 F (38.3 C), temperature source Oral, resp. rate 20, height 5\' 11"  (1.803 m), weight (!) 137.9 kg, SpO2 93 %.   Disposition: Discharge disposition: 01-Home or Self Care        Allergies as of 08/05/2018   No Known Allergies      Medication List    TAKE these medications   acetaminophen 500 MG tablet Commonly known as: TYLENOL Take 1,000 mg by mouth every 6 (six) hours as needed.   albuterol 108 (90 Base) MCG/ACT inhaler Commonly known as: VENTOLIN HFA Inhale 1-2 puffs into the lungs every 6 (six) hours as needed for wheezing or shortness of breath.   allopurinol 300 MG tablet Commonly known as: ZYLOPRIM Take 300 mg by mouth every evening.   carbidopa-levodopa 50-200 MG tablet Commonly known as: SINEMET CR Take 1 tablet by mouth 2 (two) times daily.   celecoxib 200 MG capsule Commonly known as: CELEBREX Take 1 capsule (200 mg total) by mouth 2 (two) times daily.   CENTRUM SILVER 50+MEN PO Take 1 tablet by mouth daily.   cetirizine 10 MG tablet Commonly known as: ZYRTEC Take 10 mg by mouth at bedtime.   enoxaparin 40 MG/0.4ML injection Commonly known as: LOVENOX Inject 0.4 mLs (40 mg total) into the skin daily for 14 days.   glimepiride 2 MG tablet Commonly known as: AMARYL Take 2 mg by mouth daily.   indapamide 2.5 MG tablet Commonly known as: LOZOL TAKE ONE TABLET BY MOUTH EVERY DAY What changed: when to take this   lisinopril 10 MG tablet Commonly known as: ZESTRIL Take 10 mg by mouth every morning.   metFORMIN 500 MG tablet Commonly known as: GLUCOPHAGE Take 1,000 mg by mouth 2 (two) times daily with a meal.   oxyCODONE 5 MG immediate release tablet Commonly known as: Oxy IR/ROXICODONE Take 1 tablet (5 mg total) by mouth every  4 (four) hours as needed for moderate pain (pain score 4-6).   traMADol 50 MG tablet Commonly known as: ULTRAM Take 1-2 tablets (50-100 mg total) by mouth every 4 (four) hours as needed for moderate pain. What changed:   how much to take  when to take this            Durable Medical Equipment  (From admission, onward)         Start     Ordered   08/03/18 1431  DME Walker rolling  Once    Question:  Patient needs a walker to treat with the  following condition  Answer:  S/P total hip arthroplasty   08/03/18 1430   08/03/18 1431  DME Bedside commode  Once    Question:  Patient needs a bedside commode to treat with the following condition  Answer:  S/P total hip arthroplasty   08/03/18 1430         Follow-up Information    Donato HeinzHooten, James P, MD On 09/08/2018.   Specialty: Orthopedic Surgery Why: at 1:45pm Contact information: [address] [facility] [place] [id number] [phone]           Signed: Lenard ForthMUNDY, Jalen Daluz 08/05/2018, 7:24 AM   Objective: Vital signs in last 24 hours: Temp:  [98.3 F (36.8 C)-101 F (38.3 C)] 101 F (38.3 C) (06/18 2315) Pulse Rate:  [96-134] 110 (06/19 0538) Resp:  [18-20] 20 (06/18 2315) BP: (92-113)/(49-74) 105/73 (06/19 0538) SpO2:  [93 %-98 %] 93 % (06/18 2315)  Intake/Output from previous day:  Intake/Output Summary (Last 24 hours) at 08/05/2018 0724 Last data filed at 08/05/2018 0542 Gross per 24 hour  Intake 1878.45 ml  Output 5760 ml  Net -3881.55 ml    Intake/Output this shift: No intake/output data recorded.  Labs: No results for input(s): HGB in the last 72 hours. No results for input(s): WBC, RBC, HCT, PLT in the last 72 hours. No results for input(s): NA, K, CL, CO2, BUN, CREATININE, GLUCOSE, CALCIUM in the last 72 hours. No results for input(s): LABPT, INR in the last 72 hours.  EXAM: General - Patient is Alert and Oriented Extremity - Neurovascular intact Sensation intact distally Dorsiflexion/Plantar flexion intact Compartment soft Incision - clean, dry, with the Hemovac removed.  The tubing appeared to be intact on removal. Motor Function -plantarflexion and dorsiflexion are intact.  Assessment/Plan: 2 Days Post-Op Procedure(s) (LRB): LEFT TOTAL HIP ARTHROPLASTY (Left) Procedure(s) (LRB): LEFT TOTAL HIP ARTHROPLASTY (Left) Past Medical History:  Diagnosis Date  . Arteriovenous malformation of cerebral vessels   . Arthritis   .  Carpal tunnel syndrome   . Diabetes mellitus without complication (HCC)   . Headache    h/o  . History of kidney stones   . Hyperlipidemia   . Hypertension   . Occasional tremors   . Sleep apnea    uses cpap 5-20   Active Problems:   H/O total hip arthroplasty  Estimated body mass index is 42.41 kg/m as calculated from the following:   Height as of this encounter: 5\' 11"  (1.803 m).   Weight as of this encounter: 137.9 kg. Advance diet Up with therapy D/C IV fluids Discharge home with home health Diet - Regular diet Follow up - in 6 weeks Activity - WBAT Disposition - Home Condition Upon Discharge - Stable DVT Prophylaxis - Lovenox and TED hose  Dedra Skeensodd Lurleen Soltero, PA-C Orthopaedic Surgery 08/05/2018, 7:24 AM

## 2018-08-05 NOTE — Progress Notes (Signed)
Pt ready for discharge home today per MD. Patient assessment unchanged from this morning. Pt met PT goals. Dressing changed per order, staples intact. Reviewed discharge instructions and prescriptions with pt; all questions answered and pt verbalized understanding. PIV removed, VSS. Pt assisted to car via NT.   Ethelda Chick

## 2018-08-05 NOTE — Progress Notes (Signed)
Physical Therapy Treatment Patient Details Name: Jerry FootmanChristopher W Sandoval MRN: [id number] DOB: 01/19/1966 Today's Date: 08/05/2018    History of Present Illness Pt admitted for L THR.    PT Comments    Pt able to ambulate 180 feet with RW SBA and navigate 5 steps with UE support safely.  Pain 5/10 L hip with mobility but minimal at rest.  Able to maintain and verbalize posterior hip precautions during session's activities.  HR 110 bpm at rest; increased up to 134 bpm with ambulation; and (after sitting rest break) pt's HR increased up to 142 bpm after stairs; HR decreased to 114-120 bpm at rest end of session (nurse notified of pt's HR during session).  Pt appears safe to discharge home with support of family when medically appropriate.    Follow Up Recommendations  Home health PT     Equipment Recommendations  Rolling walker with 5" wheels    Recommendations for Other Services       Precautions / Restrictions Precautions Precautions: Posterior Hip;Fall Precaution Booklet Issued: Yes (comment) Precaution Comments: Pt able to recall 3/3 precautions Restrictions Weight Bearing Restrictions: Yes LLE Weight Bearing: Weight bearing as tolerated    Mobility  Bed Mobility Overal bed mobility: Needs Assistance Bed Mobility: Supine to Sit;Sit to Supine     Supine to sit: Mod assist;Min guard Sit to supine: Min assist   General bed mobility comments: assist for trunk semi-supine to sit (mod assist) 1st trial but CGA 2nd trial (pt using head board to simulate home set-up support); min assist L LE sit to semi-supine; initial vc's for technique  Transfers Overall transfer level: Needs assistance Equipment used: Rolling walker (2 wheeled) Transfers: Sit to/from Stand Sit to Stand: Modified independent (Device/Increase time)         General transfer comment: steady safe transfers x1 from bed and x1 from recliner; no vc's required  Ambulation/Gait Ambulation/Gait assistance:  Supervision Gait Distance (Feet): 180 Feet Assistive device: Rolling walker (2 wheeled) Gait Pattern/deviations: Antalgic;Decreased stance time - left Gait velocity: mildly decreased   General Gait Details: partial step through gait pattern; steady with RW   Stairs Stairs: Yes Stairs assistance: Min guard(2nd assist present for safety but not required for physical assist) Stair Management: Step to pattern;With walker Number of Stairs: 5 General stair comments: ascended 4 steps with RW backwards and descended 4 steps with RW forwards; also navigated 1 platform step with RW; initial vc's for technique and then pt able to safely perform without further cueing   Wheelchair Mobility    Modified Rankin (Stroke Patients Only)       Balance Overall balance assessment: Modified Independent Sitting-balance support: No upper extremity supported;Feet supported Sitting balance-Leahy Scale: Normal Sitting balance - Comments: steady sitting reaching within BOS   Standing balance support: No upper extremity supported Standing balance-Leahy Scale: Good Standing balance comment: steady standing reaching within BOS                            Cognition Arousal/Alertness: Awake/alert Behavior During Therapy: WFL for tasks assessed/performed Overall Cognitive Status: Within Functional Limits for tasks assessed                                        Exercises      General Comments   Nursing cleared pt for participation in physical therapy.  Pt  agreeable to PT session.      Pertinent Vitals/Pain Pain Assessment: 0-10 Pain Score: 3  Pain Location: L hip Pain Descriptors / Indicators: Sore Pain Intervention(s): Limited activity within patient's tolerance;Monitored during session;Premedicated before session;Repositioned;Ice applied  O2 sats WFL during session's activities.    Home Living                      Prior Function            PT Goals  (current goals can now be found in the care plan section) Acute Rehab PT Goals Patient Stated Goal: to go home PT Goal Formulation: With patient Time For Goal Achievement: 08/17/18 Potential to Achieve Goals: Good Additional Goals Additional Goal #1: Pt will be able to perform bed mobility/transfers with supervision and LRAD in order to improve functional independence Progress towards PT goals: Progressing toward goals    Frequency    BID      PT Plan Current plan remains appropriate    Co-evaluation              AM-PAC PT "6 Clicks" Mobility   Outcome Measure  Help needed turning from your back to your side while in a flat bed without using bedrails?: A Little Help needed moving from lying on your back to sitting on the side of a flat bed without using bedrails?: A Little Help needed moving to and from a bed to a chair (including a wheelchair)?: A Little Help needed standing up from a chair using your arms (e.g., wheelchair or bedside chair)?: A Little Help needed to walk in hospital room?: A Little Help needed climbing 3-5 steps with a railing? : A Little 6 Click Score: 18    End of Session Equipment Utilized During Treatment: Gait belt Activity Tolerance: Patient tolerated treatment well Patient left: in chair;with call bell/phone within reach;with chair alarm set;with SCD's reapplied(B heels floating via pillows) Nurse Communication: Other (comment)(Pt's HR during session) PT Visit Diagnosis: Unsteadiness on feet (R26.81);Muscle weakness (generalized) (M62.81);Difficulty in walking, not elsewhere classified (R26.2);Pain Pain - Right/Left: Left Pain - part of body: Hip     Time: 3267-1245 PT Time Calculation (min) (ACUTE ONLY): 38 min  Charges:  $Gait Training: 23-37 mins $Therapeutic Activity: 8-22 mins                    Leitha Bleak, PT 08/05/18, 1:35 PM [phone]

## 2018-08-05 NOTE — Progress Notes (Signed)
  Subjective: 2 Days Post-Op Procedure(s) (LRB): LEFT TOTAL HIP ARTHROPLASTY (Left) Patient reports pain as mild to moderate.   Patient seen in rounds with Dr. Marry Guan. Patient is well, and has had no acute complaints or problems Plan is to go Home after hospital stay. Negative for chest pain and shortness of breath Fever: no Gastrointestinal: Negative for nausea and vomiting.  The patient had a bowel movement yesterday.  Objective: Vital signs in last 24 hours: Temp:  [98.3 F (36.8 C)-101 F (38.3 C)] 101 F (38.3 C) (06/18 2315) Pulse Rate:  [96-134] 110 (06/19 0538) Resp:  [18-20] 20 (06/18 2315) BP: (92-113)/(49-74) 105/73 (06/19 0538) SpO2:  [93 %-98 %] 93 % (06/18 2315)  Intake/Output from previous day:  Intake/Output Summary (Last 24 hours) at 08/05/2018 0721 Last data filed at 08/05/2018 0542 Gross per 24 hour  Intake 1878.45 ml  Output 5760 ml  Net -3881.55 ml    Intake/Output this shift: No intake/output data recorded.  Labs: No results for input(s): HGB in the last 72 hours. No results for input(s): WBC, RBC, HCT, PLT in the last 72 hours. No results for input(s): NA, K, CL, CO2, BUN, CREATININE, GLUCOSE, CALCIUM in the last 72 hours. No results for input(s): LABPT, INR in the last 72 hours.   EXAM General - Patient is Alert and Oriented Extremity - Neurovascular intact Sensation intact distally Dorsiflexion/Plantar flexion intact Compartment soft Dressing/Incision - clean, dry, with the Hemovac removed.  The tubing appeared to be intact on removal. Motor Function - intact, moving foot and toes well on exam.  Patient ambulated 140 feet with physical therapy  Past Medical History:  Diagnosis Date  . Arteriovenous malformation of cerebral vessels   . Arthritis   . Carpal tunnel syndrome   . Diabetes mellitus without complication ([place])   . Headache    h/o  . History of kidney stones   . Hyperlipidemia   . Hypertension   . Occasional tremors   .  Sleep apnea    uses cpap 5-20    Assessment/Plan: 2 Days Post-Op Procedure(s) (LRB): LEFT TOTAL HIP ARTHROPLASTY (Left) Active Problems:   H/O total hip arthroplasty  Estimated body mass index is 42.41 kg/m as calculated from the following:   Height as of this encounter: 5\' 11"  (1.803 m).   Weight as of this encounter: 137.9 kg. Advance diet Up with therapy D/C IV fluids Discharge home with home health plan for today  DVT Prophylaxis - Lovenox, Foot Pumps and TED hose Weight-Bearing as tolerated to left leg  Reche Dixon, PA-C Orthopaedic Surgery 08/05/2018, 7:21 AM

## 2018-08-05 NOTE — Anesthesia Postprocedure Evaluation (Signed)
Anesthesia Post Note  [name]  Procedure(s) Performed: LEFT TOTAL HIP ARTHROPLASTY (Left )  Patient location during evaluation: PACU Level of consciousness: awake and alert and oriented Pain management: pain level controlled Vital Signs Assessment: post-procedure vital signs reviewed and stable Respiratory status: spontaneous breathing Cardiovascular status: blood pressure returned to baseline Anesthetic complications: no     Last Vitals:  Vitals:   08/05/18 0743 08/05/18 1113  BP: 114/79 118/85  Pulse: (!) 118 (!) 117  Resp: 17 17  Temp: (!) 38.3 C 37.7 C  SpO2: 94% 95%    Last Pain:  Vitals:   08/05/18 1113  TempSrc: Oral  PainSc:                  [name]

## 2018-08-08 LAB — SURGICAL PATHOLOGY

## 2018-10-08 ENCOUNTER — Other Ambulatory Visit: Payer: Self-pay | Admitting: Urology

## 2018-12-08 ENCOUNTER — Other Ambulatory Visit: Payer: Self-pay | Admitting: Internal Medicine

## 2018-12-08 DIAGNOSIS — M5416 Radiculopathy, lumbar region: Secondary | ICD-10-CM

## 2018-12-22 ENCOUNTER — Other Ambulatory Visit: Payer: Self-pay

## 2018-12-22 ENCOUNTER — Ambulatory Visit
Admission: RE | Admit: 2018-12-22 | Discharge: 2018-12-22 | Disposition: A | Discharge status: Home / Self Care | Payer: BC Managed Care – PPO | Source: Ambulatory Visit | Attending: Internal Medicine | Admitting: Internal Medicine

## 2018-12-22 DIAGNOSIS — M5416 Radiculopathy, lumbar region: Secondary | ICD-10-CM

## 2019-01-17 ENCOUNTER — Other Ambulatory Visit: Payer: Self-pay | Admitting: Urology

## 2019-01-31 ENCOUNTER — Other Ambulatory Visit: Payer: Self-pay | Admitting: Neurosurgery

## 2019-01-31 DIAGNOSIS — M4306 Spondylolysis, lumbar region: Secondary | ICD-10-CM

## 2019-02-03 ENCOUNTER — Ambulatory Visit
Admission: RE | Admit: 2019-02-03 | Discharge: 2019-02-03 | Disposition: A | Discharge status: Home / Self Care | Payer: BC Managed Care – PPO | Source: Ambulatory Visit | Attending: Neurosurgery | Admitting: Neurosurgery

## 2019-02-03 DIAGNOSIS — M4306 Spondylolysis, lumbar region: Secondary | ICD-10-CM

## 2019-02-03 MED ORDER — IOPAMIDOL (ISOVUE-M 200) INJECTION 41%
1.0000 mL | Freq: Once | INTRAMUSCULAR | Status: AC
  Administered 2019-02-03: 1 mL via EPIDURAL

## 2019-02-03 MED ORDER — METHYLPREDNISOLONE ACETATE 40 MG/ML INJ SUSP (RADIOLOG
120.0000 mg | Freq: Once | INTRAMUSCULAR | Status: AC
  Administered 2019-02-03: 120 mg via EPIDURAL

## 2019-02-03 NOTE — Discharge Instructions (Signed)

## 2019-02-07 ENCOUNTER — Ambulatory Visit: Payer: BLUE CROSS/BLUE SHIELD | Admitting: Urology

## 2019-02-20 ENCOUNTER — Other Ambulatory Visit: Payer: Self-pay | Admitting: Neurosurgery

## 2019-02-20 ENCOUNTER — Encounter: Payer: Self-pay | Admitting: Urology

## 2019-02-20 DIAGNOSIS — M4306 Spondylolysis, lumbar region: Secondary | ICD-10-CM

## 2019-02-22 ENCOUNTER — Ambulatory Visit: Payer: BC Managed Care – PPO | Admitting: Urology

## 2019-02-22 ENCOUNTER — Other Ambulatory Visit: Payer: Self-pay

## 2019-02-22 ENCOUNTER — Encounter: Payer: Self-pay | Admitting: Urology

## 2019-02-22 VITALS — BP 119/69 | HR 99 | Ht 71.0 in | Wt 311.0 lb

## 2019-02-22 DIAGNOSIS — N200 Calculus of kidney: Secondary | ICD-10-CM

## 2019-02-22 DIAGNOSIS — R82994 Hypercalciuria: Secondary | ICD-10-CM

## 2019-02-22 NOTE — Progress Notes (Signed)
02/22/2019 3:45 PM   Jerry Sandoval [birth year] [id number]  Referring provider: Danella Penton, MD [phone] [facility] [address] [facility] [place],  [place] [address]  Chief Complaint  Patient presents with  . Follow-up    HPI: 54 year old male with a personal shave recurrent nephrolithiasis returns today for routine annual follow-up he was last seen in 01/2018.  He is currently managed on indapamide 2.5 mg for personal history of hypercalciuria.  He has been taking this regularly.  He has no side effects.  Last renal imaging in the form of KUB a year ago which shows a 5 mm left lower pole stone which has been stable over the past several years.  He does report that he had no episodes of flank pain, stone episodes, or any other signs or symptoms of kidney stones over the past year.  No gross hematuria.  He does report these drink plenty water.  He has not been good about salt intake and been eating more salty peanuts that he ought to especially to the holidays.   PMH: Past Medical History:  Diagnosis Date  . Arteriovenous malformation of cerebral vessels   . Arthritis   . Carpal tunnel syndrome   . Diabetes mellitus without complication (HCC)   . Headache    h/o  . History of kidney stones   . Hyperlipidemia   . Hypertension   . Occasional tremors   . Sleep apnea    uses cpap 5-20    Surgical History: Past Surgical History:  Procedure Laterality Date  . CEREBRAL ANGIOGRAM    . CHOLECYSTECTOMY N/A 10/31/2017   Procedure: LAPAROSCOPIC CHOLECYSTECTOMY and umbilical hernia repair;  Surgeon: Lattie Haw, MD;  Location: [facility] ORS;  Service: General;  Laterality: N/A;  . COLONOSCOPY WITH PROPOFOL N/A 07/09/2015   Procedure: COLONOSCOPY WITH PROPOFOL;  Surgeon: Christena Deem, MD;  Location: [facility] ENDOSCOPY;  Service: Endoscopy;  Laterality: N/A;  . EXTRACORPOREAL SHOCK WAVE LITHOTRIPSY Right 06/25/2016   Procedure: EXTRACORPOREAL SHOCK WAVE  LITHOTRIPSY (ESWL);  Surgeon: Vanna Scotland, MD;  Location: [facility] ORS;  Service: Urology;  Laterality: Right;  . TOTAL HIP ARTHROPLASTY Left 08/03/2018   Procedure: LEFT TOTAL HIP ARTHROPLASTY;  Surgeon: Donato Heinz, MD;  Location: [facility] ORS;  Service: Orthopedics;  Laterality: Left;  . WISDOM TOOTH EXTRACTION      Home Medications:  Allergies as of 02/22/2019   No Known Allergies     Medication List       Accurate as of February 22, 2019  3:45 PM. If you have any questions, ask your nurse or doctor.        STOP taking these medications   celecoxib 200 MG capsule Commonly known as: CELEBREX Stopped by: Vanna Scotland, MD   enoxaparin 40 MG/0.4ML injection Commonly known as: LOVENOX Stopped by: Vanna Scotland, MD   etodolac 400 MG tablet Commonly known as: LODINE Stopped by: Vanna Scotland, MD   oxyCODONE 5 MG immediate release tablet Commonly known as: Oxy IR/ROXICODONE Stopped by: Vanna Scotland, MD   traMADol 50 MG tablet Commonly known as: ULTRAM Stopped by: Vanna Scotland, MD     TAKE these medications   acetaminophen 500 MG tablet Commonly known as: TYLENOL Take 1,000 mg by mouth every 6 (six) hours as needed.   albuterol 108 (90 Base) MCG/ACT inhaler Commonly known as: VENTOLIN HFA Inhale 1-2 puffs into the lungs every 6 (six) hours as needed for wheezing or shortness of breath.   allopurinol 300 MG  tablet Commonly known as: ZYLOPRIM Take 300 mg by mouth every evening.   carbidopa-levodopa 50-200 MG tablet Commonly known as: SINEMET CR Take 1 tablet by mouth 2 (two) times daily.   CENTRUM SILVER 50+MEN PO Take 1 tablet by mouth daily.   cetirizine 10 MG tablet Commonly known as: ZYRTEC Take 10 mg by mouth at bedtime.   gabapentin 100 MG capsule Commonly known as: NEURONTIN Take 100 mg by mouth 4 (four) times daily.   glimepiride 2 MG tablet Commonly known as: AMARYL Take 2 mg by mouth daily.   indapamide 2.5 MG tablet Commonly known as:  LOZOL TAKE ONE TABLET EVERY MORNING   lisinopril 10 MG tablet Commonly known as: ZESTRIL Take 10 mg by mouth every morning.   metFORMIN 500 MG tablet Commonly known as: GLUCOPHAGE Take 1,000 mg by mouth 2 (two) times daily with a meal.   propranolol 10 MG tablet Commonly known as: INDERAL TAKE 1 TABLET BY MOUTH TWICE DAILY FOR 1 WEEK, 2 TABLETS TWICE DAILY FOR 2 WEEKS, THEN 3 TABLETS BY MOUTH TWICE DAILY       Allergies: No Known Allergies  Family History: Family History  Problem Relation Age of Onset  . Diabetes Father   . Hypertension Neg Hx   . CAD Neg Hx   . Prostate cancer Neg Hx   . Bladder Cancer Neg Hx   . Kidney cancer Neg Hx     Social History:  reports that he quit smoking about 15 years ago. His smoking use included cigarettes and cigars. He has a 2.50 pack-year smoking history. His smokeless tobacco use includes chew. He reports that he does not drink alcohol or use drugs.  ROS: UROLOGY Frequent Urination?: No Hard to postpone urination?: No Burning/pain with urination?: No Get up at night to urinate?: No Leakage of urine?: No Urine stream starts and stops?: No Trouble starting stream?: No Do you have to strain to urinate?: No Blood in urine?: No Urinary tract infection?: No Sexually transmitted disease?: No Injury to kidneys or bladder?: No Painful intercourse?: No Weak stream?: No Erection problems?: Yes Penile pain?: No  Gastrointestinal Nausea?: No Vomiting?: No Indigestion/heartburn?: No Diarrhea?: No Constipation?: No  Constitutional Fever: No Night sweats?: No Weight loss?: No Fatigue?: No  Skin Skin rash/lesions?: No Itching?: No  Eyes Blurred vision?: No Double vision?: No  Ears/Nose/Throat Sore throat?: No Sinus problems?: No  Hematologic/Lymphatic Swollen glands?: No Easy bruising?: No  Cardiovascular Leg swelling?: No Chest pain?: No  Respiratory Cough?: No Shortness of breath?: No  Endocrine Excessive  thirst?: No  Musculoskeletal Back pain?: No Joint pain?: No  Neurological Headaches?: No Dizziness?: No  Psychologic Depression?: No Anxiety?: No  Physical Exam: BP 119/69   Pulse 99   Ht 5\' 11"  (1.803 m)   Wt (!) 311 lb (141.1 kg)   BMI 43.38 kg/m   Constitutional:  Alert and oriented, No acute distress. HEENT: [place] AT, moist mucus membranes.  Trachea midline, no masses. Cardiovascular: No clubbing, cyanosis, or edema. Respiratory: Normal respiratory effort, no increased work of breathing. GI: Abdomen is obese Skin: No rashes, bruises or suspicious lesions. Neurologic: Grossly intact, no focal deficits, moving all 4 extremities. Psychiatric: Normal mood and affect.  Laboratory Data: Lab Results  Component Value Date   WBC 14.1 (H) 08/05/2018   HGB 11.5 (L) 08/05/2018   HCT 35.0 (L) 08/05/2018   MCV 94.6 08/05/2018   PLT 243 08/05/2018    Lab Results  Component Value Date  CREATININE 0.94 07/21/2018    Lab Results  Component Value Date   HGBA1C 5.8 (H) 07/21/2018    Urinalysis    Component Value Date/Time   COLORURINE YELLOW (A) 07/21/2018 1405   APPEARANCEUR CLEAR (A) 07/21/2018 1405   APPEARANCEUR Clear 10/16/2016 0834   LABSPEC 1.017 07/21/2018 1405   PHURINE 5.0 07/21/2018 1405   GLUCOSEU NEGATIVE 07/21/2018 1405   HGBUR NEGATIVE 07/21/2018 1405   BILIRUBINUR NEGATIVE 07/21/2018 1405   BILIRUBINUR Negative 10/16/2016 0834   KETONESUR NEGATIVE 07/21/2018 1405   PROTEINUR NEGATIVE 07/21/2018 1405   NITRITE NEGATIVE 07/21/2018 1405   LEUKOCYTESUR NEGATIVE 07/21/2018 1405    Lab Results  Component Value Date   LABMICR See below: 06/12/2016   WBCUA 0-5 06/12/2016   RBCUA 0-2 06/12/2016   LABEPIT None seen 06/12/2016   BACTERIA NONE SEEN 10/29/2017   UA reviewed  Pertinent Imaging: No new interval renal imaging  Assessment & Plan:    1. Kidney stones Asymptomatic known left lower pole stone  Given the stability of the stone, we have  deferred renal imaging today  He remains asymptomatic without any interval stone episodes  We will plan to reimage next year with KUB or sooner if he becomes symptomatic - Abdomen 1 view (KUB); Future  2. Hypercalciuria History of some benefits of continuation on indapamide  Given his lack of interval stone episodes and growth, he seems to be doing well on this medication which is relatively low risk  In summary, he would like to continue this.    Return in about 1 year (around 02/22/2020) for KUB.  Hollice Espy, MD  [facility] [address], [place], [place] [address] [phone]

## 2019-03-14 ENCOUNTER — Other Ambulatory Visit: Payer: Self-pay | Admitting: Urology

## 2019-03-17 ENCOUNTER — Other Ambulatory Visit: Payer: Self-pay

## 2019-03-17 ENCOUNTER — Ambulatory Visit
Admission: RE | Admit: 2019-03-17 | Discharge: 2019-03-17 | Disposition: A | Discharge status: Home / Self Care | Payer: BC Managed Care – PPO | Source: Ambulatory Visit | Attending: Neurosurgery | Admitting: Neurosurgery

## 2019-03-17 DIAGNOSIS — M4306 Spondylolysis, lumbar region: Secondary | ICD-10-CM

## 2019-03-17 MED ORDER — IOPAMIDOL (ISOVUE-M 200) INJECTION 41%
1.0000 mL | Freq: Once | INTRAMUSCULAR | Status: AC
  Administered 2019-03-17: 1 mL via EPIDURAL

## 2019-03-17 MED ORDER — METHYLPREDNISOLONE ACETATE 40 MG/ML INJ SUSP (RADIOLOG
120.0000 mg | Freq: Once | INTRAMUSCULAR | Status: AC
  Administered 2019-03-17: 14:00:00 120 mg via EPIDURAL

## 2019-03-17 NOTE — Discharge Instructions (Signed)

## 2019-03-18 ENCOUNTER — Other Ambulatory Visit: Payer: Self-pay | Admitting: Urology

## 2019-03-31 ENCOUNTER — Ambulatory Visit: Payer: BC Managed Care – PPO

## 2019-04-09 ENCOUNTER — Ambulatory Visit: Payer: BC Managed Care – PPO | Attending: Internal Medicine

## 2019-04-09 DIAGNOSIS — Z23 Encounter for immunization: Secondary | ICD-10-CM | POA: Insufficient documentation

## 2019-04-09 NOTE — Progress Notes (Signed)
   Covid-19 Vaccination Clinic  Name:  Jerry Sandoval    MRN: [id number] DOB: [birth year]  04/09/2019  Jerry Sandoval was observed post Covid-19 immunization for 15 minutes without incidence. He was provided with Vaccine Information Sheet and instruction to access the V-Safe system.   Jerry Sandoval was instructed to call 911 with any severe reactions post vaccine: Marland Kitchen Difficulty breathing  . Swelling of your face and throat  . A fast heartbeat  . A bad rash all over your body  . Dizziness and weakness    Immunizations Administered    Name Date Dose VIS Date Route   Pfizer COVID-19 Vaccine 04/09/2019  8:59 AM 0.3 mL 01/27/2019 Intramuscular   Manufacturer: [employer], [employer]   Lot: [id number]   NDC: [address]-3

## 2019-05-02 ENCOUNTER — Other Ambulatory Visit: Payer: Self-pay

## 2019-05-03 ENCOUNTER — Ambulatory Visit: Payer: BC Managed Care – PPO | Attending: Internal Medicine

## 2019-05-03 DIAGNOSIS — Z23 Encounter for immunization: Secondary | ICD-10-CM

## 2019-05-03 NOTE — Progress Notes (Signed)
   Covid-19 Vaccination Clinic  Name:  Jerry Sandoval    MRN: [id number] DOB: [birth year]  05/03/2019  Mr. [name] was observed post Covid-19 immunization for 15 minutes without incident. He was provided with Vaccine Information Sheet and instruction to access the V-Safe system.   Mr. [name] was instructed to call 911 with any severe reactions post vaccine: Marland Kitchen Difficulty breathing  . Swelling of face and throat  . A fast heartbeat  . A bad rash all over body  . Dizziness and weakness   Immunizations Administered    Name Date Dose VIS Date Route   Pfizer COVID-19 Vaccine 05/03/2019  8:21 AM 0.3 mL 01/27/2019 Intramuscular   Manufacturer: [employer], [employer]   Lot: [id number]   NDC: [address]-9

## 2019-05-09 MED ORDER — INDAPAMIDE 2.5 MG PO TABS: 2.5000 mg | ORAL_TABLET | Freq: Every morning | ORAL | 0 refills | Status: DC

## 2019-06-05 ENCOUNTER — Other Ambulatory Visit: Payer: Self-pay | Admitting: Urology

## 2019-06-11 ENCOUNTER — Other Ambulatory Visit: Payer: Self-pay

## 2019-12-06 ENCOUNTER — Other Ambulatory Visit: Payer: Self-pay | Admitting: Urology

## 2020-02-25 ENCOUNTER — Encounter: Payer: Self-pay | Admitting: Urology

## 2020-02-27 ENCOUNTER — Encounter: Payer: Self-pay | Admitting: Urology

## 2020-02-27 ENCOUNTER — Ambulatory Visit
Admission: RE | Admit: 2020-02-27 | Discharge: 2020-02-27 | Disposition: A | Discharge status: Home / Self Care | Payer: BC Managed Care – PPO | Attending: Urology | Admitting: Urology

## 2020-02-27 ENCOUNTER — Ambulatory Visit (INDEPENDENT_AMBULATORY_CARE_PROVIDER_SITE_OTHER): Payer: BC Managed Care – PPO | Admitting: Urology

## 2020-02-27 ENCOUNTER — Ambulatory Visit
Admission: RE | Admit: 2020-02-27 | Discharge: 2020-02-27 | Disposition: A | Discharge status: Home / Self Care | Payer: BC Managed Care – PPO | Source: Ambulatory Visit | Attending: Urology | Admitting: Urology

## 2020-02-27 ENCOUNTER — Other Ambulatory Visit: Payer: Self-pay

## 2020-02-27 VITALS — BP 130/76 | HR 97

## 2020-02-27 DIAGNOSIS — N200 Calculus of kidney: Secondary | ICD-10-CM

## 2020-02-27 DIAGNOSIS — R82994 Hypercalciuria: Secondary | ICD-10-CM

## 2020-02-27 MED ORDER — INDAPAMIDE 2.5 MG PO TABS: 2.5000 mg | ORAL_TABLET | Freq: Every morning | ORAL | 11 refills | Status: DC

## 2020-02-27 NOTE — Progress Notes (Signed)
02/27/2020 3:30 PM   Jerry Sandoval 1965/05/20 [id number]  Referring provider: Danella Penton, MD [phone] [facility] [address] [facility] [place],  [place] [address]  Chief Complaint  Patient presents with  . Nephrolithiasis    HPI: 55 year old male with a personal shave recurrent nephrolithiasis returns today for routine annual follow-up.  He is currently managed on indapamide 2.5 mg for personal history of hypercalciuria.  He has been taking this regularly.  He has no side effects.  He does report that he had no episodes of flank pain, stone episodes, or any other signs or symptoms of kidney stones over the past year.  This is unchanged.  KUB today was personally reviewed.  This compared to his KUB from 2 years ago.  He has a known nonobstructing asymptomatic left lower pole stone.  It appears to have enlarged about 2 to 3 mm now approximately 8 mm and somewhat wider than on previous KUB.  There are no new stones.   PMH: Past Medical History:  Diagnosis Date  . Arteriovenous malformation of cerebral vessels   . Arthritis   . Carpal tunnel syndrome   . Diabetes mellitus without complication (HCC)   . Headache    h/o  . History of kidney stones   . Hyperlipidemia   . Hypertension   . Occasional tremors   . Sleep apnea    uses cpap 5-20    Surgical History: Past Surgical History:  Procedure Laterality Date  . CEREBRAL ANGIOGRAM    . CHOLECYSTECTOMY N/A 10/31/2017   Procedure: LAPAROSCOPIC CHOLECYSTECTOMY and umbilical hernia repair;  Surgeon: Lattie Haw, MD;  Location: [facility] ORS;  Service: General;  Laterality: N/A;  . COLONOSCOPY WITH PROPOFOL N/A 07/09/2015   Procedure: COLONOSCOPY WITH PROPOFOL;  Surgeon: Christena Deem, MD;  Location: [facility] ENDOSCOPY;  Service: Endoscopy;  Laterality: N/A;  . EXTRACORPOREAL SHOCK WAVE LITHOTRIPSY Right 06/25/2016   Procedure: EXTRACORPOREAL SHOCK WAVE LITHOTRIPSY (ESWL);  Surgeon: Vanna Scotland, MD;   Location: [facility] ORS;  Service: Urology;  Laterality: Right;  . TOTAL HIP ARTHROPLASTY Left 08/03/2018   Procedure: LEFT TOTAL HIP ARTHROPLASTY;  Surgeon: Donato Heinz, MD;  Location: [facility] ORS;  Service: Orthopedics;  Laterality: Left;  . WISDOM TOOTH EXTRACTION      Home Medications:  Allergies as of 02/27/2020   No Known Allergies     Medication List       Accurate as of February 27, 2020  3:30 PM. If you have any questions, ask your nurse or doctor.        STOP taking these medications   carbidopa-levodopa 50-200 MG tablet Commonly known as: SINEMET CR Stopped by: Vanna Scotland, MD   gabapentin 100 MG capsule Commonly known as: NEURONTIN Stopped by: Vanna Scotland, MD     TAKE these medications   acetaminophen 500 MG tablet Commonly known as: TYLENOL Take 1,000 mg by mouth every 6 (six) hours as needed.   albuterol 108 (90 Base) MCG/ACT inhaler Commonly known as: VENTOLIN HFA Inhale 1-2 puffs into the lungs every 6 (six) hours as needed for wheezing or shortness of breath.   allopurinol 300 MG tablet Commonly known as: ZYLOPRIM Take 300 mg by mouth every evening.   CENTRUM SILVER 50+MEN PO Take 1 tablet by mouth daily.   cetirizine 10 MG tablet Commonly known as: ZYRTEC Take 10 mg by mouth at bedtime.   glimepiride 2 MG tablet Commonly known as: AMARYL Take 2 mg by mouth daily.  indapamide 2.5 MG tablet Commonly known as: LOZOL TAKE ONE TABLET EVERY MORNING   lisinopril 10 MG tablet Commonly known as: ZESTRIL Take 10 mg by mouth every morning.   metFORMIN 500 MG tablet Commonly known as: GLUCOPHAGE Take 1,000 mg by mouth 2 (two) times daily with a meal.   propranolol 10 MG tablet Commonly known as: INDERAL TAKE 1 TABLET BY MOUTH TWICE DAILY FOR 1 WEEK, 2 TABLETS TWICE DAILY FOR 2 WEEKS, THEN 3 TABLETS BY MOUTH TWICE DAILY       Allergies: No Known Allergies  Family History: Family History  Problem Relation Age of Onset  . Diabetes Father    . Hypertension Neg Hx   . CAD Neg Hx   . Prostate cancer Neg Hx   . Bladder Cancer Neg Hx   . Kidney cancer Neg Hx     Social History:  reports that he quit smoking about 16 years ago. His smoking use included cigarettes and cigars. He has a 2.50 pack-year smoking history. His smokeless tobacco use includes chew. He reports that he does not drink alcohol and does not use drugs.   Physical Exam: BP 130/76   Pulse 97   Constitutional:  Alert and oriented, No acute distress. HEENT: [place] AT, moist mucus membranes.  Trachea midline, no masses. Cardiovascular: No clubbing, cyanosis, or edema. Respiratory: Normal respiratory effort, no increased work of breathing. Skin: No rashes, bruises or suspicious lesions. Neurologic: Grossly intact, no focal deficits, moving all 4 extremities. Psychiatric: Normal mood and affect.  Laboratory  Lab Results  Component Value Date   CREATININE 0.94 07/21/2018    Pertinent Imaging: KUB interpretation as above  Assessment & Plan:    1. Kidney stones Mildly enlarged asymptomatic left lower pole stone, now approximately 8 mm, 2 mm interval growth over the past 2 years  We discussed treatment versus continued observation.  He prefer continued observation.  Will plan to reimage in about 2 years again unless he has symptoms.   2. Hypercalciuria Personal history of hypercalciuria on indapamide, stones are stabilized since taking this medication.  Would like to continue this.  Follow-up in 1 year   Vanna Scotland, MD  [facility] [address], [address] [place], [place] [address] [phone]

## 2020-09-06 ENCOUNTER — Other Ambulatory Visit: Payer: Self-pay | Admitting: Orthopedic Surgery

## 2020-09-09 ENCOUNTER — Encounter: Payer: Self-pay | Admitting: Orthopedic Surgery

## 2020-09-13 ENCOUNTER — Encounter
Admission: RE | Disposition: A | Discharge status: Home / Self Care | Payer: Self-pay | Source: Home / Self Care | Attending: Orthopedic Surgery

## 2020-09-13 ENCOUNTER — Encounter: Payer: Self-pay | Admitting: Anesthesiology

## 2020-09-13 ENCOUNTER — Other Ambulatory Visit: Payer: Self-pay

## 2020-09-13 ENCOUNTER — Ambulatory Visit: Payer: Self-pay | Admitting: Anesthesiology

## 2020-09-13 ENCOUNTER — Ambulatory Visit
Admission: RE | Admit: 2020-09-13 | Discharge: 2020-09-13 | Disposition: A | Discharge status: Home / Self Care | Payer: Worker's Compensation | Attending: Orthopedic Surgery | Admitting: Orthopedic Surgery

## 2020-09-13 ENCOUNTER — Ambulatory Visit: Payer: Self-pay

## 2020-09-13 ENCOUNTER — Encounter: Payer: Self-pay | Admitting: Orthopedic Surgery

## 2020-09-13 DIAGNOSIS — Z8249 Family history of ischemic heart disease and other diseases of the circulatory system: Secondary | ICD-10-CM | POA: Insufficient documentation

## 2020-09-13 DIAGNOSIS — Y9389 Activity, other specified: Secondary | ICD-10-CM | POA: Insufficient documentation

## 2020-09-13 DIAGNOSIS — Z7984 Long term (current) use of oral hypoglycemic drugs: Secondary | ICD-10-CM | POA: Diagnosis not present

## 2020-09-13 DIAGNOSIS — Z79899 Other long term (current) drug therapy: Secondary | ICD-10-CM | POA: Diagnosis not present

## 2020-09-13 DIAGNOSIS — Z6841 Body Mass Index (BMI) 40.0 and over, adult: Secondary | ICD-10-CM | POA: Insufficient documentation

## 2020-09-13 DIAGNOSIS — E669 Obesity, unspecified: Secondary | ICD-10-CM | POA: Insufficient documentation

## 2020-09-13 DIAGNOSIS — Z820 Family history of epilepsy and other diseases of the nervous system: Secondary | ICD-10-CM | POA: Diagnosis not present

## 2020-09-13 DIAGNOSIS — Z8261 Family history of arthritis: Secondary | ICD-10-CM | POA: Diagnosis not present

## 2020-09-13 DIAGNOSIS — S46212A Strain of muscle, fascia and tendon of other parts of biceps, left arm, initial encounter: Secondary | ICD-10-CM | POA: Insufficient documentation

## 2020-09-13 DIAGNOSIS — E119 Type 2 diabetes mellitus without complications: Secondary | ICD-10-CM | POA: Insufficient documentation

## 2020-09-13 DIAGNOSIS — Z825 Family history of asthma and other chronic lower respiratory diseases: Secondary | ICD-10-CM | POA: Insufficient documentation

## 2020-09-13 DIAGNOSIS — R52 Pain, unspecified: Secondary | ICD-10-CM

## 2020-09-13 DIAGNOSIS — Z87891 Personal history of nicotine dependence: Secondary | ICD-10-CM | POA: Diagnosis not present

## 2020-09-13 DIAGNOSIS — X500XXA Overexertion from strenuous movement or load, initial encounter: Secondary | ICD-10-CM | POA: Diagnosis not present

## 2020-09-13 HISTORY — PX: DISTAL BICEPS TENDON REPAIR: SHX1461

## 2020-09-13 LAB — GLUCOSE, CAPILLARY
Glucose-Capillary: 153 mg/dL — ABNORMAL HIGH (ref 70–99)
Glucose-Capillary: 174 mg/dL — ABNORMAL HIGH (ref 70–99)

## 2020-09-13 SURGERY — REPAIR, TENDON, BICEPS, DISTAL
Anesthesia: General | Laterality: Left

## 2020-09-13 MED ORDER — NEOMYCIN-POLYMYXIN B GU 40-200000 IR SOLN
Status: DC | PRN
  Administered 2020-09-13: 2 mL

## 2020-09-13 MED ORDER — OXYCODONE HCL 5 MG PO TABS
ORAL_TABLET | ORAL | Status: AC
  Filled 2020-09-13: qty 1

## 2020-09-13 MED ORDER — LIDOCAINE HCL (PF) 1 % IJ SOLN
INTRAMUSCULAR | Status: AC
  Filled 2020-09-13: qty 5

## 2020-09-13 MED ORDER — CHLORHEXIDINE GLUCONATE 0.12 % MT SOLN: 15.0000 mL | Freq: Once | OROMUCOSAL | Status: AC

## 2020-09-13 MED ORDER — SUCCINYLCHOLINE CHLORIDE 200 MG/10ML IV SOSY
PREFILLED_SYRINGE | INTRAVENOUS | Status: DC | PRN
  Administered 2020-09-13: 160 mg via INTRAVENOUS

## 2020-09-13 MED ORDER — LIDOCAINE HCL (PF) 1 % IJ SOLN
INTRAMUSCULAR | Status: DC | PRN
  Administered 2020-09-13: 5 mL via SUBCUTANEOUS

## 2020-09-13 MED ORDER — LABETALOL HCL 5 MG/ML IV SOLN
INTRAVENOUS | Status: AC
  Filled 2020-09-13: qty 4

## 2020-09-13 MED ORDER — LACTATED RINGERS IV SOLN: INTRAVENOUS | Status: DC | PRN

## 2020-09-13 MED ORDER — ONDANSETRON HCL 4 MG/2ML IJ SOLN: 4.0000 mg | Freq: Once | INTRAMUSCULAR | Status: DC | PRN

## 2020-09-13 MED ORDER — SODIUM CHLORIDE 0.9 % IV SOLN: INTRAVENOUS | Status: DC

## 2020-09-13 MED ORDER — ROPIVACAINE HCL 5 MG/ML IJ SOLN
INTRAMUSCULAR | Status: AC
  Filled 2020-09-13: qty 30

## 2020-09-13 MED ORDER — MIDAZOLAM HCL 2 MG/2ML IJ SOLN: 1.0000 mg | Freq: Once | INTRAMUSCULAR | Status: DC

## 2020-09-13 MED ORDER — PROPOFOL 10 MG/ML IV BOLUS
INTRAVENOUS | Status: DC | PRN
  Administered 2020-09-13: 200 mg via INTRAVENOUS

## 2020-09-13 MED ORDER — DEXTROSE 5 % IV SOLN
INTRAVENOUS | Status: DC | PRN
  Administered 2020-09-13: 3 g via INTRAVENOUS

## 2020-09-13 MED ORDER — MIDAZOLAM HCL 2 MG/2ML IJ SOLN
INTRAMUSCULAR | Status: DC | PRN
  Administered 2020-09-13: 2 mg via INTRAVENOUS

## 2020-09-13 MED ORDER — FENTANYL CITRATE (PF) 100 MCG/2ML IJ SOLN: 50.0000 ug | Freq: Once | INTRAMUSCULAR | Status: AC

## 2020-09-13 MED ORDER — MIDAZOLAM HCL 2 MG/2ML IJ SOLN
INTRAMUSCULAR | Status: AC
  Filled 2020-09-13: qty 2

## 2020-09-13 MED ORDER — ASPIRIN EC 325 MG PO TBEC: 325.0000 mg | DELAYED_RELEASE_TABLET | Freq: Every day | ORAL | 0 refills | Status: AC

## 2020-09-13 MED ORDER — FENTANYL CITRATE (PF) 100 MCG/2ML IJ SOLN
INTRAMUSCULAR | Status: AC
  Administered 2020-09-13: 50 ug via INTRAVENOUS
  Filled 2020-09-13: qty 2

## 2020-09-13 MED ORDER — FENTANYL CITRATE (PF) 100 MCG/2ML IJ SOLN
25.0000 ug | INTRAMUSCULAR | Status: DC | PRN
  Administered 2020-09-13 (×2): 50 ug via INTRAVENOUS

## 2020-09-13 MED ORDER — OXYCODONE HCL 5 MG PO TABS: 5.0000 mg | ORAL_TABLET | ORAL | 0 refills | Status: DC | PRN

## 2020-09-13 MED ORDER — LIDOCAINE HCL (CARDIAC) PF 100 MG/5ML IV SOSY
PREFILLED_SYRINGE | INTRAVENOUS | Status: DC | PRN
  Administered 2020-09-13: 100 mg via INTRAVENOUS

## 2020-09-13 MED ORDER — CHLORHEXIDINE GLUCONATE 0.12 % MT SOLN
OROMUCOSAL | Status: AC
  Administered 2020-09-13: 15 mL via OROMUCOSAL
  Filled 2020-09-13: qty 15

## 2020-09-13 MED ORDER — PROPOFOL 500 MG/50ML IV EMUL
INTRAVENOUS | Status: AC
  Filled 2020-09-13: qty 50

## 2020-09-13 MED ORDER — ACETAMINOPHEN 10 MG/ML IV SOLN: 1000.0000 mg | Freq: Once | INTRAVENOUS | Status: DC | PRN

## 2020-09-13 MED ORDER — ONDANSETRON HCL 4 MG/2ML IJ SOLN
INTRAMUSCULAR | Status: DC | PRN
  Administered 2020-09-13: 4 mg via INTRAVENOUS

## 2020-09-13 MED ORDER — ACETAMINOPHEN 10 MG/ML IV SOLN
INTRAVENOUS | Status: DC | PRN
  Administered 2020-09-13: 1000 mg via INTRAVENOUS

## 2020-09-13 MED ORDER — NEOMYCIN-POLYMYXIN B GU 40-200000 IR SOLN
Status: AC
  Filled 2020-09-13: qty 2

## 2020-09-13 MED ORDER — FENTANYL CITRATE (PF) 100 MCG/2ML IJ SOLN
INTRAMUSCULAR | Status: AC
  Filled 2020-09-13: qty 2

## 2020-09-13 MED ORDER — ACETAMINOPHEN 10 MG/ML IV SOLN
INTRAVENOUS | Status: AC
  Filled 2020-09-13: qty 100

## 2020-09-13 MED ORDER — 0.9 % SODIUM CHLORIDE (POUR BTL) OPTIME
TOPICAL | Status: DC | PRN
  Administered 2020-09-13: 500 mL

## 2020-09-13 MED ORDER — ROCURONIUM BROMIDE 100 MG/10ML IV SOLN
INTRAVENOUS | Status: DC | PRN
  Administered 2020-09-13: 10 mg via INTRAVENOUS
  Administered 2020-09-13: 60 mg via INTRAVENOUS

## 2020-09-13 MED ORDER — ONDANSETRON 4 MG PO TBDP: 4.0000 mg | ORAL_TABLET | Freq: Three times a day (TID) | ORAL | 0 refills | Status: DC | PRN

## 2020-09-13 MED ORDER — ACETAMINOPHEN 500 MG PO TABS: 1000.0000 mg | ORAL_TABLET | Freq: Three times a day (TID) | ORAL | 0 refills | Status: DC

## 2020-09-13 MED ORDER — KETOROLAC TROMETHAMINE 30 MG/ML IJ SOLN
INTRAMUSCULAR | Status: DC | PRN
  Administered 2020-09-13: 15 mg via INTRAVENOUS

## 2020-09-13 MED ORDER — PHENYLEPHRINE HCL (PRESSORS) 10 MG/ML IV SOLN
INTRAVENOUS | Status: AC
  Filled 2020-09-13: qty 1

## 2020-09-13 MED ORDER — ORAL CARE MOUTH RINSE: 15.0000 mL | Freq: Once | OROMUCOSAL | Status: AC

## 2020-09-13 MED ORDER — ROPIVACAINE HCL 5 MG/ML IJ SOLN
INTRAMUSCULAR | Status: DC | PRN
  Administered 2020-09-13: 30 mL via PERINEURAL

## 2020-09-13 MED ORDER — SUGAMMADEX SODIUM 200 MG/2ML IV SOLN
INTRAVENOUS | Status: DC | PRN
Start: 2020-09-13 — End: 2020-09-13
  Administered 2020-09-13: 200 mg via INTRAVENOUS

## 2020-09-13 MED ORDER — FENTANYL CITRATE (PF) 250 MCG/5ML IJ SOLN
INTRAMUSCULAR | Status: AC
  Filled 2020-09-13: qty 5

## 2020-09-13 MED ORDER — OXYCODONE HCL 5 MG/5ML PO SOLN
5.0000 mg | Freq: Once | ORAL | Status: AC | PRN
Start: 2020-09-13 — End: 2020-09-13

## 2020-09-13 MED ORDER — CEFAZOLIN IN SODIUM CHLORIDE 3-0.9 GM/100ML-% IV SOLN
3.0000 g | INTRAVENOUS | Status: DC
  Filled 2020-09-13: qty 100

## 2020-09-13 MED ORDER — FENTANYL CITRATE (PF) 100 MCG/2ML IJ SOLN
INTRAMUSCULAR | Status: DC | PRN
  Administered 2020-09-13 (×3): 50 ug via INTRAVENOUS

## 2020-09-13 MED ORDER — OXYCODONE HCL 5 MG PO TABS
5.0000 mg | ORAL_TABLET | Freq: Once | ORAL | Status: AC | PRN
  Administered 2020-09-13: 5 mg via ORAL

## 2020-09-13 SURGICAL SUPPLY — 47 items
ADH SKN CLS APL DERMABOND .7 (GAUZE/BANDAGES/DRESSINGS) ×1
APL PRP STRL LF DISP 70% ISPRP (MISCELLANEOUS) ×1
BNDG COHESIVE 4X5 TAN ST LF (GAUZE/BANDAGES/DRESSINGS) ×2 IMPLANT
BNDG ELASTIC 4X5.8 VLCR STR LF (GAUZE/BANDAGES/DRESSINGS) ×2 IMPLANT
BNDG ESMARK 4X12 TAN STRL LF (GAUZE/BANDAGES/DRESSINGS) ×2 IMPLANT
CANISTER SUCT 1200ML W/VALVE (MISCELLANEOUS) IMPLANT
CHLORAPREP W/TINT 26 (MISCELLANEOUS) ×2 IMPLANT
CORD BIP STRL DISP 12FT (MISCELLANEOUS) ×2 IMPLANT
CUFF TOURN SGL QUICK 18X4 (TOURNIQUET CUFF) IMPLANT
CUFF TOURN SGL QUICK 24 (TOURNIQUET CUFF) ×2
CUFF TRNQT CYL 24X4X16.5-23 (TOURNIQUET CUFF) ×1 IMPLANT
DERMABOND ADVANCED (GAUZE/BANDAGES/DRESSINGS) ×1
DERMABOND ADVANCED .7 DNX12 (GAUZE/BANDAGES/DRESSINGS) ×1 IMPLANT
DRAPE FLUOR MINI C-ARM 54X84 (DRAPES) ×2 IMPLANT
DRAPE SURG 17X11 SM STRL (DRAPES) ×2 IMPLANT
DRAPE U-SHAPE 47X51 STRL (DRAPES) ×2 IMPLANT
ELECT REM PT RETURN 9FT ADLT (ELECTROSURGICAL) ×2
ELECTRODE REM PT RTRN 9FT ADLT (ELECTROSURGICAL) ×1 IMPLANT
FORCEPS JEWEL BIP 4-3/4 STR (INSTRUMENTS) ×2 IMPLANT
GAUZE 4X4 16PLY ~~LOC~~+RFID DBL (SPONGE) ×2 IMPLANT
GAUZE SPONGE 4X4 12PLY STRL (GAUZE/BANDAGES/DRESSINGS) ×2 IMPLANT
GLOVE SURG ORTHO LTX SZ8 (GLOVE) ×6 IMPLANT
GLOVE SURG UNDER LTX SZ8 (GLOVE) ×2 IMPLANT
GOWN STRL REUS W/ TWL LRG LVL3 (GOWN DISPOSABLE) ×2 IMPLANT
GOWN STRL REUS W/TWL LRG LVL3 (GOWN DISPOSABLE) ×4
KIT TURNOVER KIT A (KITS) ×2 IMPLANT
LOOP VESSEL MINI 0.8X406 BLUE (MISCELLANEOUS) ×1 IMPLANT
LOOP VESSEL SUPERMAXI WHITE (MISCELLANEOUS) ×2 IMPLANT
LOOPS BLUE MINI 0.8X406MM (MISCELLANEOUS) ×1
MANIFOLD NEPTUNE II (INSTRUMENTS) ×2 IMPLANT
NDL MAYO CATGUT SZ5 (NEEDLE) ×2
NDL SUT 5 .5 CRC TPR PNT MAYO (NEEDLE) ×1 IMPLANT
PACK EXTREMITY ARMC (MISCELLANEOUS) ×2 IMPLANT
PAD CAST CTTN 4X4 STRL (SOFTGOODS) ×1 IMPLANT
PADDING CAST 4IN STRL (MISCELLANEOUS)
PADDING CAST BLEND 4X4 STRL (MISCELLANEOUS) IMPLANT
PADDING CAST COTTON 4X4 STRL (SOFTGOODS) ×2
REAMER LO PROFILE 8 (MISCELLANEOUS) ×2 IMPLANT
SLING ARM LRG DEEP (SOFTGOODS) ×2 IMPLANT
SPONGE T-LAP 18X18 ~~LOC~~+RFID (SPONGE) ×2 IMPLANT
STOCKINETTE IMPERVIOUS 9X36 MD (GAUZE/BANDAGES/DRESSINGS) ×2 IMPLANT
SUT 2 FIBERLOOP 20 STRT BLUE (SUTURE) ×2
SUT LASSO 90 DEG CVD (SUTURE) ×2 IMPLANT
SUT MNCRL AB 4-0 PS2 18 (SUTURE) ×2 IMPLANT
SUT VICRYL 3-0 27IN (SUTURE) ×2 IMPLANT
SUTURE 2 FIBERLOOP 20 STRT BLU (SUTURE) ×1 IMPLANT
SYSTEM ARTHRO FOR DISTAL BICEP (Orthopedic Implant) ×2 IMPLANT

## 2020-09-13 NOTE — Transfer of Care (Signed)
Immediate Anesthesia Transfer of Care Note  [name]  Procedure(s) Performed: DISTAL BICEPS TENDON REPAIR (Left)  Patient Location: PACU  Anesthesia Type:General  Level of Consciousness: awake and alert   Airway & Oxygen Therapy: Patient Spontanous Breathing and Patient connected to nasal cannula oxygen  Post-op Assessment: Report given to RN and Post -op Vital signs reviewed and stable  Post vital signs: Reviewed and stable  Last Vitals:  Vitals Value Taken Time  BP 133/101 09/13/20 1015  Temp 36.1 C 09/13/20 1012  Pulse 99 09/13/20 1017  Resp 18 09/13/20 1017  SpO2 99 % 09/13/20 1017  Vitals shown include unvalidated device data.  Last Pain:  Vitals:   09/13/20 0615  TempSrc: Oral         Complications: No notable events documented.

## 2020-09-13 NOTE — Anesthesia Procedure Notes (Signed)
Anesthesia Regional Block: Supraclavicular block   Pre-Anesthetic Checklist: , timeout performed,  Correct Patient, Correct Site, Correct Laterality,  Correct Procedure, Correct Position, site marked,  Risks and benefits discussed,  Surgical consent,  Pre-op evaluation,  At surgeon's request and post-op pain management  Laterality: Left and Upper  Prep: chloraprep       Needles:  Injection technique: Single-shot  Needle Type: Stimiplex     Needle Length: 5cm  Needle Gauge: 22     Additional Needles:   Procedures:,,,, ultrasound used (permanent image in chart),,    Narrative:  Start time: 09/13/2020 11:41 AM End time: 09/13/2020 11:47 AM Injection made incrementally with aspirations every 5 mL.  Performed by: Personally  Anesthesiologist: Lenard Simmer, MD  Additional Notes: Functioning IV was confirmed and monitors were applied.  A 22mm 22ga Stimuplex needle was used. Sterile prep and drape,hand hygiene and sterile gloves were used.  Negative aspiration and negative test dose prior to incremental administration of local anesthetic. The patient tolerated the procedure well.

## 2020-09-13 NOTE — Op Note (Signed)
Operative Note    SURGERY DATE: 09/13/2020   PRE-OP DIAGNOSIS:  1. Left distal biceps rupture   POST-OP DIAGNOSIS:  1. Left distal biceps rupture  PROCEDURES:  1. Left distal biceps repair   SURGEON: Rosealee Albee, MD   ASSISTANT: Sonny Dandy, PA   ANESTHESIA: Gen   ESTIMATED BLOOD LOSS: 5cc   TOTAL IV FLUIDS: see anesthesia record  IMPLANTS: Arthrex biceps button and 20mm interference screw   INDICATION(S):  Jerry Sandoval is a 55 y.o. male who sustained a distal biceps rupture approximately 6 weeks ago after moving heavy boxes at work. 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  After discussion of risks, benefits, and alternatives to surgery, the patient elected to proceed with distal biceps repair   OPERATIVE FINDINGS: Distal biceps rupture with significant scarring of distal biceps tendon   OPERATIVE REPORT:   I identified Jerry Sandoval in the pre-operative holding area. Informed consent was obtained and the surgical site was marked. I reviewed the risks and benefits of the proposed surgical intervention and the patient wished to proceed. The patient was transferred to the operative suite and general anesthesia was administered. The patient was placed in the supine position with arm on an arm board.  All down side pressure points were appropriately padded. Appropriate IV antibiotics were administered within 30 minutes before incision. The extremity was then prepped and draped in standard fashion. A time out was performed confirming the correct extremity, correct patient, and correct procedure.   A 6 cm transverse incision was made approximately 4 cm distal to the elbow flexion crease.  Care was taken to carefully dissect down to the fascial layer of the brachioradialis and pronator teres.  The lateral antebrachial cutaneous nerve was not encountered.  The fascial layer was then incised.  Dissection was carried proximally  to the biceps muscle and tendon.  The tendon had significant adhesions and was scarred to the surrounding tissue. It was noted to be avulsed off the radial tuberosity. All of the adhesions were meticulously dissected to free the distal end of the tendon.  The ends of the tendon were freshened with a 15 blade and tapered to allow for easier passage at the time of fixation.  A fiber loop suture was placed along the distal 20 mm of the tendon.  The tendon was sized to 7 mm.  The tendon was then wrapped in saline gauze and retracted proximally.  Deep dissection was carefully carried out to the level of the radial tuberosity using fluoroscopy for confirmation.  Care was taken to not retract on the lateral and posterior side of the radius to avoid injuring the posterior interosseous nerve.  A guidepin was placed in the center of the radial tuberosity aiming in a approximately 30 degree direction towards the ulna to avoid injuring the posterior interosseous nerve. Position was confirmed with fluoroscopy.  An 8 mm reamer was used to drill a unicortical socket over the guidepin.  All bony debris from this was removed to use the risk of heterotopic ossification.  The wound was then irrigated.  The suture tails of the fiber loop on the biceps tendon were then passed through the biceps button to allow for a tension slide technique.  The button was then inserted through the radial tuberosity to rest on the far cortex of the radius, and the fiberloop sutures were then pulled to advance the biceps tendon into the socket. One limb was then passed  through the biceps tendon using a free needle and a knot was tied over this. Next, using a [employer] driver, the 7 mm interference screw was then placed on the radial side of the socket to push the biceps tendon towards its more anatomical position on the ulnar side of the radial tuberosity. Finally, one limb was again passed through the biceps tendon and a knot was tied over the  interference screw. Fluoroscopy was used to confirm appropriate position of the hardware.  The elbow was taken through a range of motion and there was no gapping or movement of the biceps tendon.  Additionally the patient's hook test clinically was now normal.  The tourniquet was let down with a tourniquet time of 86 minutes.  There was no significant bleeding.  The wound was again copiously irrigated.  The subdermal layer was closed with 3-0 Vicryl and skin was closed with a running subcuticular 4-0 Monocryl.  Dermabond was applied. Soft, sterile dressing was applied. A sling was placed. Patient was extubated, transferred to a stretcher bed and to the post anesthesia care unit in stable condition.   Of note, assistance from a PA was essential to performing the surgery.  PA was present for the entire surgery.  PA assisted with patient positioning, retraction, instrumentation, and wound closure. The surgery would have been more difficult and had longer operative time without PA assistance.   Additionally, this case had increased complexity compared to standard distal biceps repair given the chronicity of injury. Injury occurred ~6 weeks ago. This led to significant scarring and adhesions of the distal biceps. Meticulous dissection had to be performed to free the distal biceps tendon. This added approximately 30 minutes to the surgical time compared to standard distal biceps repair.   POSTOPERATIVE PLAN: The patient will be discharged home from PACU. Operative arm to remain in sling until outpatient PT, which will start within 1 week. Patient to return to clinic in ~2 weeks for post-operative appointment.    REHAB PROTOCOL: - After 1st PT visit: Progress towards full passive RoM - 1 week - Can start active RoM - 2 weeks - Can start strengthening with 1 lb weights - 3 weeks - ADLs and active motion as tolerated. No more than 5 lb lifting. - 4 weeks - can gradually resume most normal activities ie raking  leaves, mowing yard, shooting basketball, driving, etc. No more than 10 lb lifting. - 6 weeks - Can start lifting heavier weights (~20-30 lbs) - 2-3 months - Can lift gym type weights (~80-100 lbs)

## 2020-09-13 NOTE — Anesthesia Procedure Notes (Signed)
Procedure Name: Intubation Date/Time: 09/13/2020 7:51 AM Performed by: Gilford Raid, CRNA Pre-anesthesia Checklist: Patient identified, Patient being monitored, Timeout performed, Emergency Drugs available and Suction available Patient Re-evaluated:Patient Re-evaluated prior to induction Oxygen Delivery Method: Circle system utilized Preoxygenation: Pre-oxygenation with 100% oxygen Induction Type: IV induction Ventilation: Oral airway inserted - appropriate to patient size and Mask ventilation with difficulty Laryngoscope Size: Miller and 2 Grade View: Grade I Tube type: Oral Tube size: 7.5 mm Number of attempts: 1 Airway Equipment and Method: Stylet Placement Confirmation: ETT inserted through vocal cords under direct vision, positive ETCO2 and breath sounds checked- equal and bilateral Secured at: 21 cm Tube secured with: Tape Dental Injury: Teeth and Oropharynx as per pre-operative assessment

## 2020-09-13 NOTE — Discharge Instructions (Addendum)
Post-Op Instructions - Distal Biceps Repair  1. Bracing: You will wear a sling for 3-4 days until your first PT appointment. Wean from sling after PT visit.  2. Driving: No driving for at least 2 weeks post-op.   3. Activity:  - After 1st PT visit: Progress towards full passive RoM - 1 week - Can start active RoM - 2 weeks - Can start strengthening with 1 lb weights - 3 weeks - ADLs and active motion as tolerated. No more than 5 lb lifting. - 4 weeks - can gradually resume most normal activities ie raking leaves, mowing yard, shooting basketball, driving, etc. No more than 10 lb lifting. - 6 weeks - Can start lifting heavier weights (~20-30 lbs) - 2-3 months - Can lift gym type weights (~80-100 lbs)  4. Physical Therapy: Begins 3-4 days after surgery  5. Medications:  - You will be provided a prescription for narcotic pain medicine. After surgery, take 1-2 narcotic tablets every 4 hours if needed for severe pain.  - A prescription for anti-nausea medication will be provided in case the narcotic medicine causes nausea - take 1 tablet every 6 hours only if nauseated.   - Take ASA 325mg /day x 2 weeks to help prevent DVTs/PEs (blood clots).  - Take Tylenol 1000mg  (2 extra strength tablets or 3 regular strength tablets) three times/day until your pain improves. This will reduce the amount of narcotic pain medication needed.   If you are taking prescription medication for anxiety, depression, insomnia, muscle spasm, chronic pain, or for attention deficit disorder, you are advised that you are at a higher risk of adverse effects with use of narcotics post-op, including narcotic addiction/dependence, depressed breathing, death. If you use non-prescribed substances: alcohol, marijuana, cocaine, heroin, methamphetamines, etc., you are at a higher risk of adverse effects with use of narcotics post-op, including narcotic addiction/dependence, depressed breathing, death. You are advised that taking > 50  morphine milligram equivalents (MME) of narcotic pain medication per day results in twice the risk of overdose or death. For your prescription provided: hydrocodone 5 mg - taking more than 8 tablets per day would result in > 50 morphine milligram equivalents (MME) of narcotic pain medication. Be advised that we will prescribe narcotics short-term, for acute post-operative pain only - 3 weeks for major operations such as shoulder repair/reconstruction surgeries.   6. Post-Op Appointment:  Your first post-op appointment will be 10-14 days post-op.  7. Work or School: For most, but not all procedures, we advise staying out of work or school for at least 1 to 2 weeks in order to recover from the stress of surgery and to allow time for healing.   If you need a work or school note this can be provided.   8. Smoking: If you are a smoker, you need to refrain from smoking in the postoperative period. The nicotine in cigarettes will inhibit healing of your shoulder repair and decrease the chance of successful repair. Similarly, nicotine containing products (gum, patches) should be avoided.   AMBULATORY SURGERY  DISCHARGE INSTRUCTIONS   The drugs that you were given will stay in your system until tomorrow so for the next 24 hours you should not:  Drive an automobile Make any legal decisions Drink any alcoholic beverage   You may resume regular meals tomorrow.  Today it is better to start with liquids and gradually work up to solid foods.  You may eat anything you prefer, but it is better to start with liquids, then  soup and crackers, and gradually work up to solid foods.   Please notify your doctor immediately if you have any unusual bleeding, trouble breathing, redness and pain at the surgery site, drainage, fever, or pain not relieved by medication.    Your post-operative visit with Dr.                                       is: Date:                        Time:    Please call to  schedule your post-operative visit.  Additional Instructions:

## 2020-09-13 NOTE — H&P (Signed)
Paper H&P to be scanned into permanent record. H&P reviewed. No significant changes noted.  

## 2020-09-13 NOTE — Anesthesia Preprocedure Evaluation (Addendum)
Anesthesia Evaluation  Patient identified by MRN, date of birth, ID band Patient awake    Reviewed: Allergy & Precautions, NPO status , Patient's Chart, lab work & pertinent test results  History of Anesthesia Complications Negative for: history of anesthetic complications  Airway Mallampati: II  TM Distance: >3 FB Neck ROM: Full    Dental  (+) Teeth Intact   Pulmonary asthma , sleep apnea and Continuous Positive Airway Pressure Ventilation , neg COPD, Patient abstained from smoking.Not current smoker, former smoker,    breath sounds clear to auscultation- rhonchi       Cardiovascular Exercise Tolerance: Good METShypertension, Pt. on medications (-) CAD and (-) Past MI (-) dysrhythmias  Rhythm:Regular Rate:Normal     Neuro/Psych  Headaches, negative psych ROS   GI/Hepatic Neg liver ROS, neg GERD  ,  Endo/Other  diabetes, Well Controlled, Type 2, Oral Hypoglycemic AgentsMorbid obesity  Renal/GU Renal InsufficiencyRenal disease     Musculoskeletal  (+) Arthritis , Osteoarthritis,    Abdominal (+) + obese,   Peds  Hematology negative hematology ROS (+)   Anesthesia Other Findings Past Medical History: No date: Arteriovenous malformation of cerebral vessels No date: Arthritis No date: Carpal tunnel syndrome No date: Diabetes mellitus without complication (HCC) No date: Headache     Comment:  h/o No date: History of kidney stones No date: Hyperlipidemia No date: Hypertension No date: Occasional tremors No date: Sleep apnea     Comment:  uses cpap 5-20  Reproductive/Obstetrics                             Anesthesia Physical  Anesthesia Plan  ASA: 3  Anesthesia Plan: General   Post-op Pain Management:  Regional for Post-op pain   Induction: Intravenous  PONV Risk Score and Plan: 2 and Ondansetron, Dexamethasone and Midazolam  Airway Management Planned: Oral ETT and Video  Laryngoscope Planned  Additional Equipment: None  Intra-op Plan:   Post-operative Plan: Extubation in OR  Informed Consent: I have reviewed the patients History and Physical, chart, labs and discussed the procedure including the risks, benefits and alternatives for the proposed anesthesia with the patient or authorized representative who has indicated his/her understanding and acceptance.     Dental advisory given  Plan Discussed with: CRNA and Surgeon  Anesthesia Plan Comments: (Discussed risks of anesthesia with patient, including PONV, sore throat, lip/dental damage. Rare risks discussed as well, such as cardiorespiratory and neurological sequelae. Patient understands. Discussed r/b/a of post operative supraclavicular nerve block, including:  - bleeding, infection, nerve damage - pneumothorax - shortness of breath from hemidiaphragmatic paralysis due to phrenic nerve blockade - poor or non functioning block. Patient understands. )       Anesthesia Quick Evaluation

## 2020-09-14 NOTE — Anesthesia Postprocedure Evaluation (Signed)
Anesthesia Post Note  [name]  Procedure(s) Performed: DISTAL BICEPS TENDON REPAIR (Left)  Patient location during evaluation: PACU Anesthesia Type: General Level of consciousness: awake and alert Pain management: pain level controlled Vital Signs Assessment: post-procedure vital signs reviewed and stable Respiratory status: spontaneous breathing, nonlabored ventilation, respiratory function stable and patient connected to nasal cannula oxygen Cardiovascular status: blood pressure returned to baseline and stable Postop Assessment: no apparent nausea or vomiting Anesthetic complications: no   No notable events documented.   Last Vitals:  Vitals:   09/13/20 1200 09/13/20 1213  BP: 115/70 118/82  Pulse: 93 96  Resp: 18 16  Temp:  (!) 36.4 C  SpO2: 93% 93%    Last Pain:  Vitals:   09/13/20 1150  TempSrc:   PainSc: 5                  Lenard Simmer

## 2021-02-25 NOTE — Progress Notes (Signed)
02/26/21 3:26 PM   Jerry Sandoval Mar 22, 1965 [id number]  Referring provider:  Rusty Aus, MD [facility] [place],  [place] [address] Chief Complaint  Patient presents with   Nephrolithiasis     HPI: Jerry Sandoval is a 56 y.o.male with a personal history of recurrent nephrolithiasis, who returns today for annual follow-up.   He is currently managed on indapamide 2.5 mg for personal history of hypercalciuria.  His most recent uric acid level on 11/04/2020 was 6.2.   KUB on 02/27/2020 visualized A 9 mm stone projecting over the lower pole of the left kidney.  He is doing well today. He denies any stone episodes,  modifying or aggravating factors, any gross hematuria, dysuria or suprapubic/flank pain.   PMH: Past Medical History:  Diagnosis Date   Arteriovenous malformation of cerebral vessels    Arthritis    Carpal tunnel syndrome    Diabetes mellitus without complication (HCC)    Headache    h/o   History of kidney stones    Hyperlipidemia    Hypertension    Occasional tremors    Sleep apnea    uses cpap 5-20    Surgical History: Past Surgical History:  Procedure Laterality Date   CEREBRAL ANGIOGRAM     CHOLECYSTECTOMY N/A 10/31/2017   Procedure: LAPAROSCOPIC CHOLECYSTECTOMY and umbilical hernia repair;  Surgeon: Florene Glen, MD;  Location: [facility] ORS;  Service: General;  Laterality: N/A;   COLONOSCOPY WITH PROPOFOL N/A 07/09/2015   Procedure: COLONOSCOPY WITH PROPOFOL;  Surgeon: Lollie Sails, MD;  [facility] ENDOSCOPY;  Service: Endoscopy;  Laterality: N/A;   DISTAL BICEPS TENDON REPAIR Left 09/13/2020   Procedure: DISTAL BICEPS TENDON REPAIR;  Surgeon: Leim Fabry, MD;  Location: [facility] ORS;  Service: Orthopedics;  Laterality: Left;  Diabetic sleep apnea   EXTRACORPOREAL SHOCK WAVE LITHOTRIPSY Right 06/25/2016   Procedure: EXTRACORPOREAL SHOCK WAVE LITHOTRIPSY (ESWL);  Surgeon: Hollice Espy,  MD;  Location: [facility] ORS;  Service: Urology;  Laterality: Right;   TOTAL HIP ARTHROPLASTY Left 08/03/2018   Procedure: LEFT TOTAL HIP ARTHROPLASTY;  Surgeon: Dereck Leep, MD;  Location: [facility] ORS;  Service: Orthopedics;  Laterality: Left;   WISDOM TOOTH EXTRACTION      Home Medications:  Allergies as of 02/26/2021   No Known Allergies      Medication List        Accurate as of February 26, 2021  3:26 PM. If you have any questions, ask your nurse or doctor.          STOP taking these medications    acetaminophen 500 MG tablet Commonly known as: TYLENOL Stopped by: Hollice Espy, MD   cetirizine 10 MG tablet Commonly known as: ZYRTEC Stopped by: Hollice Espy, MD   ondansetron 4 MG disintegrating tablet Commonly known as: Zofran ODT Stopped by: Hollice Espy, MD   oxyCODONE 5 MG immediate release tablet Commonly known as: Roxicodone Stopped by: Hollice Espy, MD       TAKE these medications    albuterol 108 (90 Base) MCG/ACT inhaler Commonly known as: VENTOLIN HFA Inhale 2 puffs into the lungs every 6 (six) hours as needed for wheezing or shortness of breath.   allopurinol 300 MG tablet Commonly known as: ZYLOPRIM Take 300 mg by mouth every evening.   CENTRUM SILVER 50+MEN PO Take 1 tablet by mouth daily.   Dulaglutide 0.75 MG/0.5ML Sopn Inject 0.75 mg into the skin every Wednesday.   fluticasone 50 MCG/ACT  nasal spray Commonly known as: FLONASE Place 1 spray into both nostrils daily as needed for allergies or rhinitis.   gabapentin 100 MG capsule Commonly known as: NEURONTIN Take 200 mg by mouth 4 (four) times daily.   indapamide 2.5 MG tablet Commonly known as: LOZOL Take 1 tablet (2.5 mg total) by mouth every morning.   lisinopril 10 MG tablet Commonly known as: ZESTRIL Take 10 mg by mouth every morning.   metFORMIN 500 MG tablet Commonly known as: GLUCOPHAGE Take 1,000 mg by mouth 2 (two) times daily with a meal.   propranolol 80 MG  24 hr capsule Commonly known as: INNOPRAN XL Take by mouth.   propranolol ER 80 MG 24 hr capsule Commonly known as: INDERAL LA Take 80 mg by mouth daily.   rosuvastatin 5 MG tablet Commonly known as: CRESTOR Take 5 mg by mouth daily.        Allergies: No Known Allergies  Family History: Family History  Problem Relation Age of Onset   Diabetes Father    Hypertension Neg Hx    CAD Neg Hx    Prostate cancer Neg Hx    Bladder Cancer Neg Hx    Kidney cancer Neg Hx     Social History:  reports that he quit smoking about 17 years ago. His smoking use included cigarettes and cigars. He has a 2.50 pack-year smoking history. His smokeless tobacco use includes snuff. He reports that he does not drink alcohol and does not use drugs.   Physical Exam: BP 132/85    Pulse 98    Ht 5\' 11"  (1.803 m)    Wt 300 lb (136.1 kg)    BMI 41.84 kg/m   Constitutional:  Alert and oriented, No acute distress. HEENT: [place] AT, moist mucus membranes.  Trachea midline, no masses. Cardiovascular: No clubbing, cyanosis, or edema. Respiratory: Normal respiratory effort, no increased work of breathing. Skin: No rashes, bruises or suspicious lesions. Neurologic: Grossly intact, no focal deficits, moving all 4 extremities. Psychiatric: Normal mood and affect.  Laboratory Data: Lab Results  Component Value Date   CREATININE 0.94 07/21/2018   Lab Results  Component Value Date   HGBA1C 5.8 (H) 07/21/2018    Assessment & Plan:   Kidney stones  - Asymptomatic known left lower pole stone  - We will plan to reimage next year with KUB or sooner if he becomes symptomatic - Abdomen 1 view (KUB); Future  Hypercalciuria  - Indapamide refilled today    Return in 1 year  I,Kailey Littlejohn,acting as a scribe for Hollice Espy, MD.,have documented all relevant documentation on the behalf of Hollice Espy, MD,as directed by  Hollice Espy, MD while in the presence of Hollice Espy, MD.  I have reviewed  the above documentation for accuracy and completeness, and I agree with the above.   Hollice Espy, MD   [facility] [address] [place], [place] [address] [phone]

## 2021-02-26 ENCOUNTER — Other Ambulatory Visit: Payer: Self-pay

## 2021-02-26 ENCOUNTER — Ambulatory Visit: Payer: BC Managed Care – PPO | Admitting: Urology

## 2021-02-26 VITALS — BP 132/85 | HR 98 | Ht 71.0 in | Wt 300.0 lb

## 2021-02-26 DIAGNOSIS — N200 Calculus of kidney: Secondary | ICD-10-CM | POA: Diagnosis not present

## 2021-02-26 MED ORDER — INDAPAMIDE 2.5 MG PO TABS: 2.5000 mg | ORAL_TABLET | Freq: Every morning | ORAL | 4 refills | Status: DC

## 2021-02-28 LAB — URINALYSIS, COMPLETE
Bilirubin, UA: NEGATIVE
Glucose, UA: NEGATIVE
Ketones, UA: NEGATIVE
Leukocytes,UA: NEGATIVE
Nitrite, UA: NEGATIVE
Specific Gravity, UA: 1.01 (ref 1.005–1.030)
Urobilinogen, Ur: 0.2 mg/dL (ref 0.2–1.0)
pH, UA: 5.5 (ref 5.0–7.5)

## 2021-02-28 LAB — MICROSCOPIC EXAMINATION
Bacteria, UA: NONE SEEN
Epithelial Cells (non renal): NONE SEEN /hpf (ref 0–10)

## 2021-05-14 DIAGNOSIS — E119 Type 2 diabetes mellitus without complications: Secondary | ICD-10-CM | POA: Insufficient documentation

## 2022-03-03 ENCOUNTER — Ambulatory Visit: Payer: BC Managed Care – PPO | Admitting: Urology

## 2022-03-31 ENCOUNTER — Other Ambulatory Visit: Payer: Self-pay

## 2022-03-31 DIAGNOSIS — N200 Calculus of kidney: Secondary | ICD-10-CM

## 2022-04-01 ENCOUNTER — Ambulatory Visit: Payer: BC Managed Care – PPO | Admitting: Urology

## 2022-04-01 ENCOUNTER — Ambulatory Visit
Admission: RE | Admit: 2022-04-01 | Discharge: 2022-04-01 | Disposition: A | Discharge status: Home / Self Care | Payer: BC Managed Care – PPO | Attending: Urology | Admitting: Urology

## 2022-04-01 ENCOUNTER — Ambulatory Visit
Admission: RE | Admit: 2022-04-01 | Discharge: 2022-04-01 | Disposition: A | Discharge status: Home / Self Care | Payer: BC Managed Care – PPO | Source: Ambulatory Visit | Attending: Urology | Admitting: Urology

## 2022-04-01 VITALS — BP 125/80 | HR 96 | Ht 71.0 in | Wt 302.1 lb

## 2022-04-01 DIAGNOSIS — R82994 Hypercalciuria: Secondary | ICD-10-CM | POA: Diagnosis not present

## 2022-04-01 DIAGNOSIS — N200 Calculus of kidney: Secondary | ICD-10-CM

## 2022-04-01 NOTE — Progress Notes (Signed)
Jerry Sandoval,acting as a scribe for Hollice Espy, MD.,have documented all relevant documentation on the behalf of Hollice Espy, MD,as directed by  Hollice Espy, MD while in the presence of Hollice Espy, MD.  04/01/2022 10:46 AM   Jerry Sandoval 1965/09/16 [id number]  Referring provider: Rusty Aus, MD [facility] [place],  [place] [address]  Chief Complaint  Patient presents with   Nephrolithiasis    FOLLOW UP    HPI: 57 year-old male with recurrent nephrolithiasis who returns today for a one year follow up with KUB.   He is managed on 2.5 mg of indapamide for hypercalciuria.   KUB today was personally reviewed. He has a stable, approximately 10 mm stone over the left lower pole of the kidney that is possibly 1-2 mm larger in depth or breadth from KUB in 2022.   He had labs performed in 10/2021. Uric acid levels were within normal limits.  Chromium was 1.1. Potassium was 4.3.   PMH: Past Medical History:  Diagnosis Date   Arteriovenous malformation of cerebral vessels    Arthritis    Carpal tunnel syndrome    Diabetes mellitus without complication (HCC)    Headache    h/o   History of kidney stones    Hyperlipidemia    Hypertension    Occasional tremors    Sleep apnea    uses cpap 5-20    Surgical History: Past Surgical History:  Procedure Laterality Date   CEREBRAL ANGIOGRAM     CHOLECYSTECTOMY N/A 10/31/2017   Procedure: LAPAROSCOPIC CHOLECYSTECTOMY and umbilical hernia repair;  Surgeon: Florene Glen, MD;  Location: [facility] ORS;  Service: General;  Laterality: N/A;   COLONOSCOPY WITH PROPOFOL N/A 07/09/2015   Procedure: COLONOSCOPY WITH PROPOFOL;  Surgeon: Lollie Sails, MD;  Location: [facility] ENDOSCOPY;  Service: Endoscopy;  Laterality: N/A;   DISTAL BICEPS TENDON REPAIR Left 09/13/2020   Procedure: DISTAL BICEPS TENDON REPAIR;  Surgeon: Leim Fabry, MD;  Location: [facility] ORS;  Service:  Orthopedics;  Laterality: Left;  Diabetic sleep apnea   EXTRACORPOREAL SHOCK WAVE LITHOTRIPSY Right 06/25/2016   Procedure: EXTRACORPOREAL SHOCK WAVE LITHOTRIPSY (ESWL);  Surgeon: Hollice Espy, MD;  Location: [facility] ORS;  Service: Urology;  Laterality: Right;   TOTAL HIP ARTHROPLASTY Left 08/03/2018   Procedure: LEFT TOTAL HIP ARTHROPLASTY;  Surgeon: Dereck Leep, MD;  Location: [facility] ORS;  Service: Orthopedics;  Laterality: Left;   WISDOM TOOTH EXTRACTION      Home Medications:  Allergies as of 04/01/2022   No Known Allergies      Medication List        Accurate as of April 01, 2022 10:46 AM. If you have any questions, ask your nurse or doctor.          STOP taking these medications    Dulaglutide 0.75 MG/0.5ML Sopn Stopped by: Hollice Espy, MD   gabapentin 100 MG capsule Commonly known as: NEURONTIN Stopped by: Hollice Espy, MD       TAKE these medications    albuterol 108 (90 Base) MCG/ACT inhaler Commonly known as: VENTOLIN HFA Inhale 2 puffs into the lungs every 6 (six) hours as needed for wheezing or shortness of breath.   allopurinol 300 MG tablet Commonly known as: ZYLOPRIM Take 300 mg by mouth every evening.   CENTRUM SILVER 50+MEN PO Take 1 tablet by mouth daily.   fluticasone 50 MCG/ACT nasal spray Commonly known as: FLONASE Place 1 spray into both nostrils  daily as needed for allergies or rhinitis.   glimepiride 4 MG tablet Commonly known as: AMARYL Take 4 mg by mouth daily with breakfast.   indapamide 2.5 MG tablet Commonly known as: LOZOL Take 1 tablet (2.5 mg total) by mouth every morning.   lisinopril 10 MG tablet Commonly known as: ZESTRIL Take 10 mg by mouth every morning.   metFORMIN 500 MG tablet Commonly known as: GLUCOPHAGE Take 1,000 mg by mouth 2 (two) times daily with a meal.   Ozempic (0.25 or 0.5 MG/DOSE) 2 MG/3ML Sopn Generic drug: Semaglutide(0.25 or 0.5MG/DOS) Inject 0.5 mg into the skin once a week.    propranolol ER 120 MG 24 hr capsule Commonly known as: INDERAL LA Take 120 mg by mouth daily. What changed: Another medication with the same name was removed. Continue taking this medication, and follow the directions you see here. Changed by: Hollice Espy, MD   rosuvastatin 5 MG tablet Commonly known as: CRESTOR Take 5 mg by mouth daily.        Family History: Family History  Problem Relation Age of Onset   Diabetes Father    Hypertension Neg Hx    CAD Neg Hx    Prostate cancer Neg Hx    Bladder Cancer Neg Hx    Kidney cancer Neg Hx     Social History:  reports that he quit smoking about 18 years ago. His smoking use included cigarettes and cigars. He has a 2.50 pack-year smoking history. His smokeless tobacco use includes snuff. He reports that he does not drink alcohol and does not use drugs.   Physical Exam: BP 125/80   Pulse 96   Ht 5' 11"$  (1.803 m)   Wt (!) 302 lb 2 oz (137 kg)   BMI 42.14 kg/m   Constitutional:  Alert and oriented, No acute distress. HEENT: [place] AT, moist mucus membranes.  Trachea midline, no masses. Neurologic: Grossly intact, no focal deficits, moving all 4 extremities. Psychiatric: Normal mood and affect.   Assessment & Plan:    1. Hypercalciuria - Continue the indapamide. - He has had minimal stone episodes or interval growth of his overall stone burden since being managed on this medication.   2. Left kidney stone - Essentially stable on KUB x 2 years - Will defer imaging next year unless he becomes symptomatic.   Return in about 1 year (around 04/02/2023).  I have reviewed the above documentation for accuracy and completeness, and I agree with the above.   Hollice Espy, MD   [facility] [address] [place],  [address] [phone]

## 2023-03-05 ENCOUNTER — Other Ambulatory Visit: Payer: Self-pay | Admitting: Student

## 2023-03-05 DIAGNOSIS — Q282 Arteriovenous malformation of cerebral vessels: Secondary | ICD-10-CM

## 2023-03-05 DIAGNOSIS — G4733 Obstructive sleep apnea (adult) (pediatric): Secondary | ICD-10-CM

## 2023-03-17 ENCOUNTER — Encounter: Payer: Self-pay | Admitting: Neurology

## 2023-03-30 ENCOUNTER — Ambulatory Visit: Payer: PRIVATE HEALTH INSURANCE | Admitting: Urology

## 2023-03-30 VITALS — BP 120/78 | HR 106 | Ht 71.0 in | Wt 306.5 lb

## 2023-03-30 DIAGNOSIS — R82994 Hypercalciuria: Secondary | ICD-10-CM

## 2023-03-30 DIAGNOSIS — N200 Calculus of kidney: Secondary | ICD-10-CM | POA: Diagnosis not present

## 2023-03-30 MED ORDER — INDAPAMIDE 2.5 MG PO TABS: 2.5000 mg | ORAL_TABLET | Freq: Every morning | ORAL | 3 refills | Status: AC

## 2023-03-30 NOTE — Patient Instructions (Signed)

## 2023-03-30 NOTE — Progress Notes (Signed)
I,Amy L Pierron,acting as a scribe for Vanna Scotland, MD.,have documented all relevant documentation on the behalf of Vanna Scotland, MD,as directed by  Vanna Scotland, MD while in the presence of Vanna Scotland, MD.  03/30/2023 3:20 PM   Jimmy Footman Aug 21, 1965 [id number]  Referring provider: Danella Penton, MD [phone] [facility] [address] [facility] [place],  [place] [address]  Chief Complaint  Patient presents with   Nephrolithiasis    HPI: 58 year-old male with a personal history of recurrent nephrolithiasis and hypercalciuria (managed on Indapamide 2.5 mg) presents today for annual follow-up.  His KUB last year showed multiple stones up to 10 mm in the left lower pole, all of which was unchanged from his previous x-ray 2 years prior.   Most recent labs reviewed from October 2024. His PSA was normal. His BMP indicated a creatinine of 1. His potassium was 4.3. His uric acid level was also within normal limits at 6.2.  He is doing well overall with no new symptoms or complaints. He is still taking the Indapamide.   PMH: Past Medical History:  Diagnosis Date   Arteriovenous malformation of cerebral vessels    Arthritis    Carpal tunnel syndrome    Diabetes mellitus without complication (HCC)    Headache    h/o   History of kidney stones    Hyperlipidemia    Hypertension    Occasional tremors    Sleep apnea    uses cpap 5-20    Surgical History: Past Surgical History:  Procedure Laterality Date   CEREBRAL ANGIOGRAM     CHOLECYSTECTOMY N/A 10/31/2017   Procedure: LAPAROSCOPIC CHOLECYSTECTOMY and umbilical hernia repair;  Surgeon: Lattie Haw, MD;  Location: [facility] ORS;  Service: General;  Laterality: N/A;   COLONOSCOPY WITH PROPOFOL N/A 07/09/2015   Procedure: COLONOSCOPY WITH PROPOFOL;  Surgeon: Christena Deem, MD;  Location[facility] ENDOSCOPY;  Service: Endoscopy;  Laterality: N/A;   DISTAL BICEPS TENDON REPAIR Left 09/13/2020   Procedure:  DISTAL BICEPS TENDON REPAIR;  Surgeon: Signa Kell, MD;  Location: [facility] ORS;  Service: Orthopedics;  Laterality: Left;  Diabetic sleep apnea   EXTRACORPOREAL SHOCK WAVE LITHOTRIPSY Right 06/25/2016   Procedure: EXTRACORPOREAL SHOCK WAVE LITHOTRIPSY (ESWL);  Surgeon: Vanna Scotland, MD;  Location: [facility] ORS;  Service: Urology;  Laterality: Right;   TOTAL HIP ARTHROPLASTY Left 08/03/2018   Procedure: LEFT TOTAL HIP ARTHROPLASTY;  Surgeon: Donato Heinz, MD;  Location: [facility] ORS;  Service: Orthopedics;  Laterality: Left;   WISDOM TOOTH EXTRACTION      Home Medications:  Allergies as of 03/30/2023   No Known Allergies      Medication List        Accurate as of March 30, 2023  3:20 PM. If you have any questions, ask your nurse or doctor.          STOP taking these medications    fluticasone 50 MCG/ACT nasal spray Commonly known as: FLONASE   lisinopril 10 MG tablet Commonly known as: ZESTRIL       TAKE these medications    albuterol 108 (90 Base) MCG/ACT inhaler Commonly known as: VENTOLIN HFA Inhale 2 puffs into the lungs every 6 (six) hours as needed for wheezing or shortness of breath.   allopurinol 300 MG tablet Commonly known as: ZYLOPRIM Take 300 mg by mouth every evening.   CENTRUM SILVER 50+MEN PO Take 1 tablet by mouth daily.   glimepiride 4 MG tablet Commonly known as: AMARYL Take  4 mg by mouth daily with breakfast.   indapamide 2.5 MG tablet Commonly known as: LOZOL Take 1 tablet (2.5 mg total) by mouth every morning.   Melatonin 5 MG Chew Chew by mouth. 2 gummies as needed   metFORMIN 500 MG tablet Commonly known as: GLUCOPHAGE Take 1,000 mg by mouth 2 (two) times daily with a meal.   Ozempic (0.25 or 0.5 MG/DOSE) 2 MG/3ML Sopn Generic drug: Semaglutide(0.25 or 0.5MG /DOS) Inject 0.5 mg into the skin once a week.   propranolol ER 120 MG 24 hr capsule Commonly known as: INDERAL LA Take 120 mg by mouth 2 (two) times daily. What changed:  Another medication with the same name was removed. Continue taking this medication, and follow the directions you see here.   rosuvastatin 5 MG tablet Commonly known as: CRESTOR Take 5 mg by mouth daily.        Family History: Family History  Problem Relation Age of Onset   Diabetes Father    Hypertension Neg Hx    CAD Neg Hx    Prostate cancer Neg Hx    Bladder Cancer Neg Hx    Kidney cancer Neg Hx     Social History:  reports that he quit smoking about 19 years ago. His smoking use included cigarettes and cigars. He started smoking about 29 years ago. He has a 2.5 pack-year smoking history. His smokeless tobacco use includes snuff. He reports that he does not drink alcohol and does not use drugs.   Physical Exam: BP 120/78   Pulse (!) 106   Ht 5\' 11"  (1.803 m)   Wt (!) 306 lb 8 oz (139 kg)   BMI 42.75 kg/m   Constitutional:  Alert and oriented, No acute distress. HEENT: [place] AT, moist mucus membranes.  Trachea midline, no masses. Neurologic: Grossly intact, no focal deficits, moving all 4 extremities. Psychiatric: Normal mood and affect.   Pertinent Imaging: Abdomen 1 view (KUB)  Narrative CLINICAL DATA:  Follow-up kidney stones  EXAM: ABDOMEN - 1 VIEW  COMPARISON:  02/27/2020  FINDINGS: Scattered large and small bowel gas is noted. A persistent left-sided renal calculus is noted measuring up to 10.7 mm slightly larger than that seen on the prior exam. The right kidney is obscured by bowel content. Left hip replacement is seen. Degenerative changes of the thoracolumbar spine are noted.  IMPRESSION: Persistent left renal stone in the lower pole measuring up to 10.7 mm.  Electronically Signed By: Alcide Clever M.D. On: 04/03/2022 21:42 Personally reviewed the above image and agree with radiologic interpretation.  Assessment & Plan:    1. Hypercalciuria  - Continue Indapamide, refill sent to pharmacy.   2. Left kidney stone - Essentially stable on KUB  last year. - Defer imaging this year since no symptoms of stones over this past year.    Return in about 1 year (around 03/29/2024).  I have reviewed the above documentation for accuracy and completeness, and I agree with the above.   Vanna Scotland, MD   [facility] [address] [place], [place] [address] [phone]

## 2023-03-31 ENCOUNTER — Ambulatory Visit: Payer: Self-pay | Admitting: Urology

## 2023-04-28 ENCOUNTER — Ambulatory Visit: Payer: PRIVATE HEALTH INSURANCE | Admitting: Urology

## 2024-03-30 ENCOUNTER — Ambulatory Visit: Payer: PRIVATE HEALTH INSURANCE | Admitting: Physician Assistant
